# Patient Record
Sex: Female | Born: 1970 | Race: White | Hispanic: No | Marital: Married | Smoking: Never smoker
Health system: Southern US, Community
[De-identification: ages and names within clinical notes are randomized; demographics above are authoritative.]

## PROBLEM LIST (undated history)

## (undated) DIAGNOSIS — L299 Pruritus, unspecified: Secondary | ICD-10-CM

## (undated) DIAGNOSIS — F329 Major depressive disorder, single episode, unspecified: Secondary | ICD-10-CM

## (undated) DIAGNOSIS — A6000 Herpesviral infection of urogenital system, unspecified: Secondary | ICD-10-CM

## (undated) DIAGNOSIS — M542 Cervicalgia: Secondary | ICD-10-CM

## (undated) DIAGNOSIS — K219 Gastro-esophageal reflux disease without esophagitis: Secondary | ICD-10-CM

## (undated) DIAGNOSIS — F419 Anxiety disorder, unspecified: Secondary | ICD-10-CM

## (undated) DIAGNOSIS — E782 Mixed hyperlipidemia: Secondary | ICD-10-CM

## (undated) DIAGNOSIS — D071 Carcinoma in situ of vulva: Secondary | ICD-10-CM

## (undated) DIAGNOSIS — M545 Low back pain: Secondary | ICD-10-CM

## (undated) HISTORY — DX: Major depressive disorder, single episode, unspecified: F32.9

## (undated) HISTORY — DX: Low back pain: M54.5

## (undated) HISTORY — DX: Cervicalgia: M54.2

## (undated) HISTORY — DX: Anxiety disorder, unspecified: F41.9

## (undated) HISTORY — DX: Pruritus, unspecified: L29.9

## (undated) HISTORY — DX: Mixed hyperlipidemia: E78.2

## (undated) HISTORY — PX: DILATION AND EVACUATION: SHX1459

## (undated) HISTORY — DX: Gastro-esophageal reflux disease without esophagitis: K21.9

---

## 1995-08-23 HISTORY — PX: INNER EAR SURGERY: SHX679

## 1995-08-23 HISTORY — PX: APPENDECTOMY: SHX54

## 2007-08-14 ENCOUNTER — Inpatient Hospital Stay (HOSPITAL_COMMUNITY): Admission: AD | Admit: 2007-08-14 | Discharge: 2007-08-14 | Payer: Self-pay | Admitting: Obstetrics and Gynecology

## 2007-09-17 ENCOUNTER — Inpatient Hospital Stay (HOSPITAL_COMMUNITY): Admission: RE | Admit: 2007-09-17 | Discharge: 2007-09-20 | Payer: Self-pay | Admitting: Obstetrics and Gynecology

## 2007-09-21 ENCOUNTER — Encounter: Admission: RE | Admit: 2007-09-21 | Discharge: 2007-10-18 | Payer: Self-pay | Admitting: Obstetrics and Gynecology

## 2008-09-04 ENCOUNTER — Ambulatory Visit (HOSPITAL_COMMUNITY): Admission: RE | Admit: 2008-09-04 | Discharge: 2008-09-04 | Payer: Self-pay | Admitting: Obstetrics and Gynecology

## 2008-10-02 ENCOUNTER — Ambulatory Visit (HOSPITAL_COMMUNITY): Admission: RE | Admit: 2008-10-02 | Discharge: 2008-10-02 | Payer: Self-pay | Admitting: Obstetrics and Gynecology

## 2008-10-17 ENCOUNTER — Emergency Department (HOSPITAL_COMMUNITY): Admission: EM | Admit: 2008-10-17 | Discharge: 2008-10-17 | Payer: Self-pay | Admitting: Emergency Medicine

## 2008-11-03 ENCOUNTER — Ambulatory Visit (HOSPITAL_COMMUNITY): Admission: RE | Admit: 2008-11-03 | Discharge: 2008-11-03 | Payer: Self-pay | Admitting: Obstetrics and Gynecology

## 2008-11-06 ENCOUNTER — Ambulatory Visit (HOSPITAL_COMMUNITY): Admission: RE | Admit: 2008-11-06 | Discharge: 2008-11-06 | Payer: Self-pay | Admitting: Obstetrics and Gynecology

## 2008-11-14 ENCOUNTER — Ambulatory Visit (HOSPITAL_COMMUNITY): Admission: RE | Admit: 2008-11-14 | Discharge: 2008-11-14 | Payer: Self-pay | Admitting: Obstetrics and Gynecology

## 2008-12-31 ENCOUNTER — Inpatient Hospital Stay (HOSPITAL_COMMUNITY): Admission: AD | Admit: 2008-12-31 | Discharge: 2008-12-31 | Payer: Self-pay | Admitting: Obstetrics and Gynecology

## 2009-04-10 ENCOUNTER — Inpatient Hospital Stay (HOSPITAL_COMMUNITY): Admission: RE | Admit: 2009-04-10 | Discharge: 2009-04-12 | Payer: Self-pay | Admitting: Obstetrics and Gynecology

## 2009-04-10 ENCOUNTER — Encounter (INDEPENDENT_AMBULATORY_CARE_PROVIDER_SITE_OTHER): Payer: Self-pay | Admitting: Obstetrics and Gynecology

## 2010-05-18 ENCOUNTER — Ambulatory Visit (HOSPITAL_COMMUNITY): Admission: RE | Admit: 2010-05-18 | Discharge: 2010-05-18 | Payer: Self-pay | Admitting: Obstetrics and Gynecology

## 2010-06-15 ENCOUNTER — Ambulatory Visit (HOSPITAL_COMMUNITY): Admission: RE | Admit: 2010-06-15 | Discharge: 2010-06-15 | Payer: Self-pay | Admitting: Obstetrics and Gynecology

## 2010-07-22 ENCOUNTER — Inpatient Hospital Stay (HOSPITAL_COMMUNITY): Admission: AD | Admit: 2010-07-22 | Payer: Self-pay | Admitting: Obstetrics and Gynecology

## 2010-08-03 ENCOUNTER — Ambulatory Visit (HOSPITAL_COMMUNITY)
Admission: RE | Admit: 2010-08-03 | Discharge: 2010-08-03 | Payer: Self-pay | Source: Home / Self Care | Attending: Obstetrics and Gynecology | Admitting: Obstetrics and Gynecology

## 2010-08-13 ENCOUNTER — Inpatient Hospital Stay (HOSPITAL_COMMUNITY)
Admission: AD | Admit: 2010-08-13 | Discharge: 2010-08-13 | Payer: Self-pay | Source: Home / Self Care | Attending: Obstetrics and Gynecology | Admitting: Obstetrics and Gynecology

## 2010-08-31 ENCOUNTER — Ambulatory Visit (HOSPITAL_COMMUNITY): Admission: RE | Admit: 2010-08-31 | Payer: Self-pay | Source: Home / Self Care | Admitting: Obstetrics and Gynecology

## 2010-09-01 ENCOUNTER — Ambulatory Visit (HOSPITAL_COMMUNITY)
Admission: RE | Admit: 2010-09-01 | Discharge: 2010-09-01 | Payer: Self-pay | Source: Home / Self Care | Attending: Obstetrics and Gynecology | Admitting: Obstetrics and Gynecology

## 2010-09-11 ENCOUNTER — Other Ambulatory Visit (HOSPITAL_COMMUNITY): Payer: Self-pay | Admitting: Obstetrics and Gynecology

## 2010-09-11 DIAGNOSIS — O2690 Pregnancy related conditions, unspecified, unspecified trimester: Secondary | ICD-10-CM

## 2010-09-12 ENCOUNTER — Encounter: Payer: Self-pay | Admitting: Obstetrics and Gynecology

## 2010-09-29 ENCOUNTER — Ambulatory Visit (HOSPITAL_COMMUNITY)
Admission: RE | Admit: 2010-09-29 | Discharge: 2010-09-29 | Disposition: A | Discharge status: Home / Self Care | Payer: 59 | Source: Ambulatory Visit | Attending: Obstetrics and Gynecology | Admitting: Obstetrics and Gynecology

## 2010-09-29 DIAGNOSIS — O358XX0 Maternal care for other (suspected) fetal abnormality and damage, not applicable or unspecified: Secondary | ICD-10-CM | POA: Insufficient documentation

## 2010-09-29 DIAGNOSIS — O34219 Maternal care for unspecified type scar from previous cesarean delivery: Secondary | ICD-10-CM | POA: Insufficient documentation

## 2010-09-29 DIAGNOSIS — O2690 Pregnancy related conditions, unspecified, unspecified trimester: Secondary | ICD-10-CM

## 2010-09-29 DIAGNOSIS — O09529 Supervision of elderly multigravida, unspecified trimester: Secondary | ICD-10-CM | POA: Insufficient documentation

## 2010-10-07 ENCOUNTER — Encounter (HOSPITAL_COMMUNITY)
Admission: RE | Admit: 2010-10-07 | Discharge: 2010-10-07 | Disposition: A | Discharge status: Home / Self Care | Payer: 59 | Source: Ambulatory Visit | Attending: Obstetrics and Gynecology | Admitting: Obstetrics and Gynecology

## 2010-10-07 DIAGNOSIS — Z01812 Encounter for preprocedural laboratory examination: Secondary | ICD-10-CM | POA: Insufficient documentation

## 2010-10-07 LAB — CBC
MCH: 31.3 pg (ref 26.0–34.0)
MCHC: 32.7 g/dL (ref 30.0–36.0)
Platelets: 166 10*3/uL (ref 150–400)
RBC: 3.84 MIL/uL — ABNORMAL LOW (ref 3.87–5.11)

## 2010-10-07 LAB — SURGICAL PCR SCREEN: Staphylococcus aureus: POSITIVE — AB

## 2010-10-14 ENCOUNTER — Other Ambulatory Visit: Payer: Self-pay | Admitting: Obstetrics and Gynecology

## 2010-10-14 ENCOUNTER — Inpatient Hospital Stay (HOSPITAL_COMMUNITY)
Admission: RE | Admit: 2010-10-14 | Discharge: 2010-10-16 | DRG: 766 | Disposition: A | Discharge status: Home / Self Care | Payer: 59 | Source: Ambulatory Visit | Attending: Obstetrics and Gynecology | Admitting: Obstetrics and Gynecology

## 2010-10-14 ENCOUNTER — Inpatient Hospital Stay (HOSPITAL_COMMUNITY): Admission: AD | Admit: 2010-10-14 | Payer: Self-pay | Source: Ambulatory Visit | Admitting: Obstetrics and Gynecology

## 2010-10-14 DIAGNOSIS — Z302 Encounter for sterilization: Secondary | ICD-10-CM

## 2010-10-14 DIAGNOSIS — O34219 Maternal care for unspecified type scar from previous cesarean delivery: Principal | ICD-10-CM | POA: Diagnosis present

## 2010-10-15 LAB — CBC
HCT: 34.1 % — ABNORMAL LOW (ref 36.0–46.0)
Hemoglobin: 11 g/dL — ABNORMAL LOW (ref 12.0–15.0)
MCV: 96.9 fL (ref 78.0–100.0)
RBC: 3.52 MIL/uL — ABNORMAL LOW (ref 3.87–5.11)
WBC: 12.6 10*3/uL — ABNORMAL HIGH (ref 4.0–10.5)

## 2010-11-01 LAB — URINALYSIS, ROUTINE W REFLEX MICROSCOPIC
Glucose, UA: NEGATIVE mg/dL
Specific Gravity, Urine: 1.025 (ref 1.005–1.030)
Urobilinogen, UA: 0.2 mg/dL (ref 0.0–1.0)
pH: 6 (ref 5.0–8.0)

## 2010-11-01 LAB — URINE MICROSCOPIC-ADD ON

## 2010-11-08 NOTE — Op Note (Signed)
[name], [name]          ACCOUNT NO.:  [id number]  MEDICAL RECORD NO.:  [id number]         PATIENT TYPE:  WINP  LOCATION:  113                           FACILITY:  [facility]  PHYSICIAN:  Guy Sandifer. Henderson Cloud, M.D. DATE OF BIRTH:  February 07, 1971  DATE OF PROCEDURE: DATE OF DISCHARGE:                              OPERATIVE REPORT   PREOPERATIVE DIAGNOSES: 1. Previous cesarean section. 2. Desires permanent sterilization.  POSTOPERATIVE DIAGNOSIS: 1. Previous cesarean section. 2. Desires permanent sterilization.  PROCEDURE:  Repeat low transverse cesarean section and bilateral tubal ligation.  SURGEON:  Guy Sandifer. Henderson Cloud, MD  ANESTHESIA:  Spinal, Angelica Pou, MD  SPECIMENS:  Placenta and bilateral fallopian tube segments, both to pathology.  FINDINGS:  Viable female infant, Apgars of 9 and 9 at 1 and 5 minutes respectively.  Birth weight 8 pounds 8 ounces.  Arterial cord pH pending.  Estimated blood loss 500 mL.  INDICATIONS AND CONSENT:  This patient is a 40 year old married white female G8, P3, whose pregnancy has been complicated by three previous cesarean sections.  The history of aneuploidy with spontaneous miscarriage x2, advanced maternal age, and this pregnancy, a finding of echogenic bowel and choroid plexus cysts on ultrasound.  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  A previous cystic fibrosis screen on the patient had been negative.  Serial ultrasounds revealed resolution of the findings of both echogenic bowel and the choroid plexus cysts. Repeat cesarean section was recommended.  The patient desires permanent sterilization.  Potential risks and complications have been discussed preoperatively, including but limited to, infection, organ damage, bleeding requiring transfusion of blood products with HIV and hepatitis acquisition, DVT, PE, pneumonia.  Alternate methods of contraception, permanence of tubal  ligation, failure rate, increased ectopic risk have also been reviewed.  All questions have been answered, and consent is signed on the chart.  PROCEDURE:  The patient was identified, taken to the operating room. Spinal anesthetic is placed.  She is placed in a dorsal supine position with a 15-degree left lateral wedge.  Time-out undertaken.  She is prepped, Foley catheter was placed in the bladder to drain, and she is draped in a sterile fashion.  After testing for adequate spinal anesthesia, skin was entered through a Pfannenstiel incision taking out the old scar on the way in.  Dissection is carried out in layers to the peritoneum.  Peritoneum was incised superiorly and inferiorly. Vesicouterine peritoneum was taken down cephalad laterally, the bladder flap was developed and the bladder blade was placed.  Uterus was incised in a low transverse manner, the uterine cavity was entered bluntly. Uterine incision was extended cephalad laterally.  Thin meconium was noted and Pediatrics was notified at that point.  Vertex was delivered, baby easily delivers.  Cord is clamped and cut.  Baby is noted to be voiding.  Baby was handed to the awaiting pediatrics team.  Arterial cord pH, cord blood for routine studies on the baby and cord blood for blood banking per patient request were all obtained.  Placenta was then manually delivered and sent to pathology.  Uterine cavity is clean. Uterus is  closed in two running locking imbricating layers of 0 Monocryl suture which achieves good hemostasis.  Uterus is delivered from the incision for the second imbricating layer.  Tubes and ovaries were noted to be normal bilaterally.  Left fallopian tube was identified from cornu to fimbria.  It was grasped in its mid ampullary portion with a Babcock clamp and a knuckle of fallopian tube was doubly ligated with two free ties of 0 plain suture.  The intervening knuckle of tube was sharply resected.  Cautery was  used to assure complete hemostasis.  Similar procedure was carried out on the right side.  Uterus was returned to the abdomen.  Irrigation was carried out and all returns was clear. Anterior peritoneum was closed in running fashion with 0 Monocryl suture which was also used to reapproximate the pyramidalis muscle in midline. Anterior rectus fascia is closed in a running fashion with a 0 looped PDS suture.  Skin was closed with clips.  All sponge, instrument, needle counts were correct, and the patient is transferred to the recovery room in stable condition.     Guy Sandifer Henderson Cloud, M.D.     JET/MEDQ  D:  10/14/2010  T:  10/14/2010  Job:  [id number]  Electronically Signed by Harold Hedge M.D. on 11/08/2010 09:13:37 AM

## 2010-11-08 NOTE — Op Note (Signed)
  NAMEMARGEE, [name]          ACCOUNT NO.:  [id number]  MEDICAL RECORD NO.:  [id number]           PATIENT TYPE:  I  LOCATION:  9113                          [facility]  PHYSICIAN:  Guy Sandifer. Henderson Cloud, M.D. DATE OF BIRTH:  Jul 26, 1971  DATE OF PROCEDURE:  10/14/2010 DATE OF DISCHARGE:                              OPERATIVE REPORT   PREOPERATIVE DIAGNOSES: 1. Previous cesarean section. 2. Desires permanent sterilization.  POSTOPERATIVE DIAGNOSES: 1. Previous cesarean section. 2. Desires permanent sterilization.  PROCEDURES: 1. Repeat low transverse cesarean section. 2. Bilateral tubal ligation.  SURGEON:  Guy Sandifer. Henderson Cloud, MD  ANESTHESIA:  Spinal, Guy Sandifer. Henderson Cloud, MD  ESTIMATED BLOOD LOSS:  500 mL.  SPECIMENS: 1. Placenta. 2. Bilateral fallopian tube segments.  Both to Pathology.   Dictation ended at this point.     Guy Sandifer Henderson Cloud, M.D.     JET/MEDQ  D:  10/14/2010  T:  10/14/2010  Job:  [id number]  Electronically Signed by Harold Hedge M.D. on 11/08/2010 09:13:31 AM

## 2010-11-11 NOTE — Discharge Summary (Signed)
  NAMEDEVANNY, [name]          ACCOUNT NO.:  [id number]  MEDICAL RECORD NO.:  [id number]           PATIENT TYPE:  I  LOCATION:  9113                          [facility]  [facility]  PHYSICIAN:  Sarafina Puthoff L. Jatavia Keltner, M.D.DATE OF BIRTH:  February 09, 1971  DATE OF ADMISSION:  10/14/2010 DATE OF DISCHARGE:  10/16/2010                              DISCHARGE SUMMARY   ADMITTING DIAGNOSES: 1. Intrauterine pregnancy at term. 2. Previous cesarean section, desires repeat. 3. Multiparity, desires permanent sterilization.  DISCHARGE DIAGNOSES: 1. Status post low transverse cesarean section. 2. Viable female infant. 3. Permanent sterilization.  REASON FOR ADMISSION:  Please see dictated H and P.  HOSPITAL COURSE:  The patient is a 40 year old white married female, gravida 8, para 3, that presents to [facility] for scheduled cesarean section.  The patient had 3 previous cesarean sections and desired repeat.  Due to multiparity, the patient also requested permanent sterilization.  On the morning of admission, the patient was taken to the operating room where spinal anesthesia was administered without difficulty.  A low transverse incision was made, delivery of a viable female infant weighing 8 pounds and 8 ounces, Apgars of 9 at 1 minute and 9 at 5 minutes.  The patient tolerated the procedure well and taken to the recovery room in stable condition.  On postoperative day #1, the patient was doing well.  Vital signs were stable.  Abdomen soft.  Fundus firm and nontender.  On postoperative day #2, the patient was doing well and desired early discharge.  Vital signs were stable.  Abdomen soft.  Fundus firm and nontender.  Incision was clean, dry, and intact.  Laboratory findings showed hemoglobin 11.0, platelet count of 155,000, blood type is shown to be O+.  Discharge instructions were reviewed and the patient was later discharged home.  CONDITION ON DISCHARGE:  Stable.  DIET:  Regular as  tolerated.  ACTIVITY:  No heavy lifting, no driving x2 weeks, no vaginal entry.  FOLLOWUP:  The patient should follow up in the office in 1 week for an incision check.  She is to call for temperature greater than 100 degrees, persistent nausea, vomiting, heavy vaginal bleeding, and/or redness or drainage from the incisional site.  DISCHARGE MEDICATIONS: 1. Tylox, #30, 1 p.o. every 4-6 hours p.r.n. 2. Motrin 600 mg every 6 hours. 3. Prenatal vitamins 1 p.o. daily. 4. Colace 1 p.o. daily p.r.n.     Julio Sicks, N.P.   ______________________________ Stann Mainland. Vincente Poli, M.D.    CC/MEDQ  D:  11/05/2010  T:  11/06/2010  Job:  [id number]  Electronically Signed by Julio Sicks N.P. on 11/08/2010 08:21:37 AM Electronically Signed by Marcelle Overlie M.D. on 11/11/2010 01:43:36 PM

## 2010-11-27 LAB — CBC
Hemoglobin: 10.5 g/dL — ABNORMAL LOW (ref 12.0–15.0)
MCV: 100.4 fL — ABNORMAL HIGH (ref 78.0–100.0)
Platelets: 163 10*3/uL (ref 150–400)
RDW: 15.4 % (ref 11.5–15.5)
WBC: 9.2 10*3/uL (ref 4.0–10.5)

## 2010-11-27 LAB — RPR: RPR Ser Ql: NONREACTIVE

## 2010-12-02 LAB — URINALYSIS, MICROSCOPIC ONLY
Hgb urine dipstick: NEGATIVE
Leukocytes, UA: NEGATIVE
Nitrite: NEGATIVE
Specific Gravity, Urine: 1.02 (ref 1.005–1.030)
Urobilinogen, UA: 0.2 mg/dL (ref 0.0–1.0)

## 2010-12-02 LAB — URINE CULTURE

## 2010-12-16 ENCOUNTER — Ambulatory Visit: Payer: 59 | Admitting: Family Medicine

## 2011-01-03 ENCOUNTER — Ambulatory Visit (INDEPENDENT_AMBULATORY_CARE_PROVIDER_SITE_OTHER): Payer: 59 | Admitting: Family Medicine

## 2011-01-03 ENCOUNTER — Ambulatory Visit (INDEPENDENT_AMBULATORY_CARE_PROVIDER_SITE_OTHER)
Admission: RE | Admit: 2011-01-03 | Discharge: 2011-01-03 | Disposition: A | Discharge status: Home / Self Care | Payer: 59 | Source: Ambulatory Visit | Attending: Family Medicine | Admitting: Family Medicine

## 2011-01-03 ENCOUNTER — Encounter: Payer: Self-pay | Admitting: Family Medicine

## 2011-01-03 DIAGNOSIS — E663 Overweight: Secondary | ICD-10-CM

## 2011-01-03 DIAGNOSIS — M79609 Pain in unspecified limb: Secondary | ICD-10-CM

## 2011-01-03 DIAGNOSIS — Z0000 Encounter for general adult medical examination without abnormal findings: Secondary | ICD-10-CM

## 2011-01-03 DIAGNOSIS — M79671 Pain in right foot: Secondary | ICD-10-CM | POA: Insufficient documentation

## 2011-01-03 DIAGNOSIS — H9312 Tinnitus, left ear: Secondary | ICD-10-CM

## 2011-01-03 DIAGNOSIS — M79642 Pain in left hand: Secondary | ICD-10-CM

## 2011-01-03 DIAGNOSIS — B009 Herpesviral infection, unspecified: Secondary | ICD-10-CM

## 2011-01-03 DIAGNOSIS — H9319 Tinnitus, unspecified ear: Secondary | ICD-10-CM

## 2011-01-03 LAB — HEPATIC FUNCTION PANEL
ALT: 26 U/L (ref 0–35)
AST: 24 U/L (ref 0–37)
Alkaline Phosphatase: 78 U/L (ref 39–117)
Total Bilirubin: 0.5 mg/dL (ref 0.3–1.2)

## 2011-01-03 LAB — CBC WITH DIFFERENTIAL/PLATELET
Basophils Absolute: 0 10*3/uL (ref 0.0–0.1)
Eosinophils Absolute: 0.1 10*3/uL (ref 0.0–0.7)
HCT: 39.8 % (ref 36.0–46.0)
Hemoglobin: 13.7 g/dL (ref 12.0–15.0)
Lymphs Abs: 2.7 10*3/uL (ref 0.7–4.0)
MCHC: 34.6 g/dL (ref 30.0–36.0)
MCV: 94.4 fl (ref 78.0–100.0)
Monocytes Absolute: 0.5 10*3/uL (ref 0.1–1.0)
Neutro Abs: 4.1 10*3/uL (ref 1.4–7.7)
RDW: 13.8 % (ref 11.5–14.6)

## 2011-01-03 LAB — LIPID PANEL
HDL: 63 mg/dL (ref >39.00)
Total CHOL/HDL Ratio: 3
VLDL: 8.8 mg/dL (ref 0.0–40.0)

## 2011-01-03 LAB — SEDIMENTATION RATE: Sed Rate: 15 mm/hr (ref 0–22)

## 2011-01-03 LAB — RENAL FUNCTION PANEL
Albumin: 4 g/dL (ref 3.5–5.2)
BUN: 11 mg/dL (ref 6–23)
Creatinine, Ser: 0.8 mg/dL (ref 0.4–1.2)
GFR: 88.48 mL/min (ref >60.00)
Phosphorus: 3.7 mg/dL (ref 2.3–4.6)

## 2011-01-03 MED ORDER — VALACYCLOVIR HCL 500 MG PO TABS: 500.0000 mg | ORAL_TABLET | Freq: Two times a day (BID) | ORAL | Status: DC

## 2011-01-03 NOTE — Progress Notes (Signed)
[name] [id number] 11/20/1970 01/03/2011      Progress Note New Patient  Subjective  Chief Complaint  Chief Complaint  Patient presents with  . Establish Care    new patient    HPI  Patient is a 40 year old Caucasian female who is in today to establish new patient appointment. 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 She was placed on Valtrex and lesions resolved initially only to recur in the last few days. She reports she worse lesions the first time and at this point only has one small and posteriorly on the left hand side. She does not have history of cold sores. She is not worried about her marital relationship and feels that these lesions are from a long history in the past. She is otherwise doing relatively well she is beyond mom of 4 young children ages 6 years, 3 years, 21 months and now 3 months. She is not getting adequate sleep nor is she exercising more able to eat a regular basis. She had increased stress with her mother and her demented grandmother recently living nearby as well. She denies fevers, chills, chest pain, palpitations, shortness of breath, GI or GU complaints. She denies vaginal discharge or other lesions. She's never had a mammogram and she is nursing her child and has nursed her final 3.  Past Medical History  Diagnosis Date  . 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 Hand pain, left 01/03/2011  . Overweight 01/03/2011  . Right foot pain 01/03/2011  . Tinnitus, left 01/03/2011    Past Surgical History  Procedure Date  . Cesarean section     X 4  . Inner ear surgery 1997  . Appendectomy 1997    Family History  Problem Relation Age of Onset  . Hypertension Mother   . Heart disease Father     smoker  . Dementia Maternal Grandmother   . COPD Maternal Grandfather     smoker  . Cancer Maternal Grandfather     intestinal     History   Social History  . Marital Status: Married    Spouse Name: N/A    Number of Children: N/A  . Years of Education: N/A   Occupational History  . Not on file.   Social History Main Topics  . Smoking status: Never Smoker   . Smokeless tobacco: Never Used  . Alcohol Use: No  . Drug Use: No  . Sexually Active: Yes -- Female partner(s)   Other Topics Concern  . Not on file   Social History Narrative  . No narrative on file    No current outpatient prescriptions on file prior to visit.    Allergies  Allergen Reactions  . Augmentin Rash    Review of Systems  Review of Systems  Constitutional: Negative for fever, chills and malaise/fatigue.  HENT: Negative for hearing loss, nosebleeds and congestion.   Eyes: Negative for discharge.  Respiratory: Negative for cough, sputum production, shortness of breath and wheezing.   Cardiovascular: Negative for chest pain, palpitations and leg swelling.  Gastrointestinal: Negative for heartburn, nausea, vomiting, abdominal pain, diarrhea, constipation and blood in stool.  Genitourinary: Negative for dysuria, urgency, frequency and hematuria.  Musculoskeletal: Negative for myalgias, back pain and falls.       [Left hand pain s/p fall 3 weeks ago, right foot pain over posterior arch  with mild swelling Skin: Negative for rash.  Neurological: Negative for dizziness, tremors, sensory change, focal weakness, loss of consciousness, weakness and headaches.  Endo/Heme/Allergies: Negative for polydipsia. Does not bruise/bleed easily.  Psychiatric/Behavioral: Negative for depression and suicidal ideas. The patient is not nervous/anxious and does not have insomnia.     Objective  BP 112/72  Pulse 72  Temp(Src) 97.9 F (36.6 C) (Oral)  Ht 5' 1.5" (1.562 m)  Wt 161 lb 12.8 oz (73.392 kg)  BMI 30.08 kg/m2  SpO2 99%  Physical Exam  Physical Exam  Genitourinary: No vaginal discharge found.       Small, roughly 2 mm split in the  mucosa at the posterior forchette, tender with palp       Assessment & Plan  Right foot pain Patient reports a small lesion at the back portion of her left foot arch. She reports has been present for several months sometimes tender and sometimes not, does not appear to be enlarging and she denies any history of trauma or verrucous lesions. On palpation it appears to be a very small, roughly 4 mm, subcutaneous lesion consistent with a fibroma or a neuroma. She is encouraged to ice it regularly and massage with Aspercreme and to report if symptoms worsen or new symptoms develop.  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 The youngest child is 66 months of age. She is getting broken up putting out adequate sleep. She is very busy and has not been exercising. She is encouraged to try and are about 7 a sleep to eat small frequent meals lean proteins and complex carbs at the time even a basic walk daily we will check TSH, CBC, liver renal and an FLP for further evaluation  HSV-1 infection Recently had her first painful ulcerated lesions which were examined and tested by her OB/GYN. She was told that they were HSV 1 lesions. She was treated with Valtrex either milligrams twice daily for 3 days she improved but now believes the lesions are returning. On exam she does appear to have a small ulcerative lesion at the posterior forchette. We will refill Valtrex 500mg  po bid but this time for 7 days and we will request records from her OB/GYN office.  Hand pain, left Patient reports roughly 3 weeks ago she fell while holding her young child and landed on the back part of her left hand along the ulnar edge. At that time she had pain but no significant swelling or ecchymosis. Unfortunately as the weeks have gone by the pain is improved but not resolved and she is concerned about the small fracture we will check an x-ray today and she is encouraged to ice it and apply Aspercreme daily  report any worsening  Tinnitus, left Patient reports years ago she fell while cleaning her ear with a qtip resulting in a damaged ear drum and stapes. 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

## 2011-01-03 NOTE — Assessment & Plan Note (Signed)
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 The youngest child is 61 months of age. She is getting broken up putting out adequate sleep. She is very busy and has not been exercising. She is encouraged to try and are about 7 a sleep to eat small frequent meals lean proteins and complex carbs at the time even a basic walk daily we will check TSH, CBC, liver renal and an FLP for further evaluation

## 2011-01-03 NOTE — Assessment & Plan Note (Signed)
Patient reports a small lesion at the back portion of her left foot arch. She reports has been present for several months sometimes tender and sometimes not, does not appear to be enlarging and she denies any history of trauma or verrucous lesions. On palpation it appears to be a very small, roughly 4 mm, subcutaneous lesion consistent with a fibroma or a neuroma. She is encouraged to ice it regularly and massage with Aspercreme and to report if symptoms worsen or new symptoms develop.

## 2011-01-03 NOTE — Assessment & Plan Note (Signed)
Recently had her first painful ulcerated lesions which were examined and tested by her OB/GYN. She was told that they were HSV 1 lesions. She was treated with Valtrex either milligrams twice daily for 3 days she improved but now believes the lesions are returning. On exam she does appear to have a small ulcerative lesion at the posterior forchette. We will refill Valtrex 500mg  po bid but this time for 7 days and we will request records from her OB/GYN office.

## 2011-01-03 NOTE — Assessment & Plan Note (Signed)
Patient reports years ago she fell while cleaning her ear with a qtip resulting in a damaged ear drum and stapes. 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

## 2011-01-03 NOTE — Assessment & Plan Note (Signed)
Patient reports roughly 3 weeks ago she fell while holding her young child and landed on the back part of her left hand along the ulnar edge. At that time she had pain but no significant swelling or ecchymosis. Unfortunately as the weeks have gone by the pain is improved but not resolved and she is concerned about the small fracture we will check an x-ray today and she is encouraged to ice it and apply Aspercreme daily report any worsening

## 2011-01-04 NOTE — Discharge Summary (Signed)
[name], [name]          ACCOUNT NO.:  [id number]   MEDICAL RECORD NO.:  [id number]          PATIENT TYPE:  INP   LOCATION:  9133                          [facility]ACILITY:  [facility]   PHYSICIAN:  Michelle L. Grewal, M.D.DATE OF BIRTH:  [birth year]   DATE OF ADMISSION:  09/17/2007  DATE OF DISCHARGE:  09/20/2007                               DISCHARGE SUMMARY   ADMITTING DIAGNOSIS:  1. Intrauterine pregnancy at term.  2. Previous cesarean delivery, desires repeat.   DISCHARGE DIAGNOSIS:  1. Status post low transverse cesarean section.  2. Viable female infant.   PROCEDURE:  Repeat low transverse cesarean section.   REASON FOR ADMISSION:  Please see dictated H&P.   HOSPITAL COURSE:  The patient is a 40 year old, gravida 5, para 1 that  was admitted to [facility] for a scheduled cesarean  section.  The patient had had a previous cesarean delivery and desired  repeat.  On the morning of admission, the patient was taken to the  operating room where spinal anesthesia was administered without  difficulty.  A low transverse incision was made with delivery of a  viable female infant weighing 7 pounds 14 ounces with Apgars of 9 at 1  minute and 9 at 5 minutes.  The patient tolerated the procedure well and  was taken to the recovery room in stable condition.  On postoperative  day #1, the patient was without complaint.  Vital signs were stable.  She is afebrile.  Abdomen soft with good return of bowel function.  Fundus was firm and nontender.  Abdominal dressing was partially removed  revealing an incision that was clean, dry and intact.  The Foley had  been discontinued and the patient was voiding well. Laboratory findings  revealed a hemoglobin of 10.6, platelet count 134,000, WBC count of 8.8.  Blood type was noted to be O+. The saline lock was discontinued and the  patient was allowed to shower. On postoperative day #2, the patient  stated that she had had some difficulty  receiving pain medication from  the previous day.  She had decided not to be discharged today as  previously planned. Vital signs were stable.  She is afebrile.  Abdomen  soft.  Fundus firm and nontender.  Incision was clean, dry and intact.  She is ambulating well. On postoperative day #3, the patient stated that  she was feeling better.  She was ready for discharge.  Vital signs were  stable. She was afebrile.  Abdomen soft.  Fundus firm and nontender. The  incision was clean, dry and intact.  Staples removed and the patient was  later discharged home.   CONDITION ON DISCHARGE:  Good.   DIET:  Regular as tolerated.   ACTIVITY:  No heavy lifting. No driving x2 weeks, no vaginal entry.   FOLLOW UP:  The patient to follow up in the office in 1-2 weeks for an  incision check.  She is to call for a temperature greater than 100  degrees, persistent nausea, vomiting, heavy vaginal bleeding and/or  redness or drainage from the incisional site.   DISCHARGE MEDICATIONS:  1. Tylox #30  one p.o. every 4-6 hours.  2. Motrin 600 mg every 6 hours.  3. Prenatal vitamins 1 p.o. daily.  4. Colace 1 p.o. daily.      Julio Sicks, N.P.      Stann Mainland. Vincente Poli, M.D.  Electronically Signed    CC/MEDQ  D:  09/30/2007  T:  10/01/2007  Job:  [id number]

## 2011-01-04 NOTE — Op Note (Signed)
[name], [name]          ACCOUNT NO.:  [id number]   MEDICAL RECORD NO.:  [id number]          PATIENT TYPE:  INP   LOCATION:  9105                          FACILITY:  [facility]   PHYSICIAN:  Guy Sandifer. Henderson Cloud, M.D. DATE OF BIRTH:  1971-01-20   DATE OF PROCEDURE:  04/10/2009  DATE OF DISCHARGE:                               OPERATIVE REPORT   PREOPERATIVE DIAGNOSES:  1. Intrauterine pregnancy at 61 weeks' estimated gestational age.  2. Previous cesarean section x2.   POSTOPERATIVE DIAGNOSES:  1. Intrauterine pregnancy at 45 weeks' estimated gestational age.  2. Previous cesarean section x2.   PROCEDURE:  Repeat low transverse cesarean section.   SURGEON:  Guy Sandifer. Henderson Cloud, MD   ANESTHESIA:  Spinal.   SPECIMENS:  1. Placenta to pathology.  2. Cord blood for private blood banking per patient request.   FINDINGS:  Viable female infant, Apgars of 9 and 9 at one and five minutes  respectively.  Birth weight 8 pounds 15 ounces.  Arterial cord pH 7.12.   ESTIMATED BLOOD LOSS:  600 mL.   INDICATIONS AND CONSENT:  This patient is a 40 year old married white  female with two previous cesarean sections.  She is admitted for repeat.  Potential risks and complications had been discussed preoperatively  including, but not limited to infection, organ damage, bleeding  requiring transfusion of blood products with HIV, and hepatitis  acquisition, DVT, PE, pneumonia.  All questions had been answered and  consent was signed on the chart.   PROCEDURE IN DETAIL:  The patient was taken to the operating room where  she was identified, spinal anesthetic was placed and she was placed in  dorsal supine position with a 15-degree left lateral wedge.  She was  then prepped, Foley catheter was placed, and the bladder was drained.  She was draped in a sterile fashion.  Time-out undertaken.  After  testing for adequate spinal anesthesia, skin was entered through a  Pfannenstiel incision and the old  scar was taken out on the way in.  Dissection was carried out in layers to the peritoneum.  There was some  scarring in the subfascial tissues.  Peritoneum was incised, extended  superiorly and inferiorly.  Vesicouterine peritoneum was taken down  cephalad laterally.  Uterus was incised in a low transverse manner and  the uterine cavity was entered bluntly with a hemostat.  The uterine  incision was extended cephalad laterally with fingers.  Artificial  rupture of membranes for clear fluid is carried out.  Vertex was  delivered without difficulty.  Oral and nasopharynx were thoroughly  suctioned.  Nuchal cord x1 was reduced and the remainder of the baby was  delivered.  Good cry and tone was noted.  Cord was clamped and cut.  The  baby was handed to the awaiting pediatrics team.  Placenta was then  manually delivered.  The uterine cavity is clean.  The uterus was closed  in two running locking imbricating layers of 0 Monocryl suture.  Good  hemostasis was noted.  Tubes and ovaries are normal bilaterally.  Anterior peritoneum was closed in running fashion with 0 Monocryl  suture, which was also used to reapproximate the pyramidalis muscle in  the midline.  Anterior rectus fascia was closed in running fashion with  a double-stranded zero PDS suture.  Skin was closed with clips.  All  sponge, instrument, and needle counts are correct and the patient was  transferred to the recovery room in stable condition.      Guy Sandifer Henderson Cloud, M.D.  Electronically Signed     JET/MEDQ  D:  04/10/2009  T:  04/11/2009  Job:  [id number]

## 2011-01-04 NOTE — Op Note (Signed)
[name], [name] NO.:  [id number]   MEDICAL RECORD NO.:  [id number]          PATIENT TYPE:  INP   LOCATION:  9133                          FACILITY:  [facility]   PHYSICIAN:  Duke Salvia. Marcelle Overlie, M.D.DATE OF BIRTH:  Oct 18, 1970   DATE OF PROCEDURE:  09/17/2007  DATE OF DISCHARGE:                               OPERATIVE REPORT   PREOPERATIVE DIAGNOSIS:  Term intrauterine pregnancy, previous cesarean  section, for repeat.   POSTOPERATIVE DIAGNOSIS:  Term intrauterine pregnancy, previous cesarean  section, for repeat.   PROCEDURE:  Repeat low transverse cesarean section.   SURGEON:  Duke Salvia. Marcelle Overlie, M.D.   ANESTHESIA:  Spinal.   COMPLICATIONS:  None.   DRAINS:  Foley catheter.   BLOOD LOSS:  700 mL.   PROCEDURE AND FINDINGS:  The patient taken to the operating room.  After  adequate level of spinal anesthetic was obtained with the patient's legs  supine and in left tilt position.  The abdomen prepped and draped usual  manner for sterile abdominal procedures.  Foley catheter positioned  draining clear urine.  Transverse incision was made excising the old  scar, carried down to the fascia which was incised and extended  transversely.  Rectus muscles divided in the midline.  Peritoneum  entered superiorly without incident and extended vertically.  The  vesicouterine serosa was incised and the bladder was bluntly and sharply  dissected below.  Bladder blade repositioned.  Transverse incision made  in the lower segment, extended with blunt dissection.  Clear fluid  noted.  The patient delivered of a female 7 pounds 14 ounces, Apgars  09/09 in the vertex presentation.  The infant was suctioned, cord  clamped and passed to the pediatrician for further care.  Cord blood was  obtained for private banking.  Placenta delivered spontaneously intact.  Uterus exteriorized, cavity wiped clean with laparotomy pack, closure  was obtained first layer of 0-0 chromic in a  locked fashion followed by  an imbricating layer of 0-0 chromic.  This was hemostatic.  Tubes and  ovaries inspection noted to be normal.  The bladder flap area was intact  and hemostatic.  Prior to closure, sponge, needle, instrument counts  reported as correct x2.  Peritoneum closed with a 3-0 Vicryl running  suture.  Rectus muscles reapproximated in the midline with 3-0 Vicryl  interrupted sutures.  Fascia closed from laterally to  midline on either side with a 0-0 PDS suture.  Subcutaneous tissue was  hemostatic.  Clips and Steri-Strips used on the skin.  Sterile pressure  dressing was applied.  Clear urine noted at end of the case.  She did  receive Pitocin IV and also Ancef 1 gram IV preoperatively.  Mother and  baby in good condition at that point.      Richard M. Marcelle Overlie, M.D.  Electronically Signed     [facility]/MEDQ  D:  09/17/2007  T:  09/17/2007  Job:  [id number]

## 2011-01-04 NOTE — H&P (Signed)
[name], [name] NO.:  [id number]   MEDICAL RECORD NO.:  [id number]          PATIENT TYPE:  INP   LOCATION:  NA                            FACILITY:  [facility]   PHYSICIAN:  Duke Salvia. Marcelle Overlie, M.D.DATE OF BIRTH:  [birth year]   DATE OF ADMISSION:  DATE OF DISCHARGE:                              HISTORY & PHYSICAL   DATE OF SCHEDULED SURGERY:  September 17, 2007.   CHIEF COMPLAINT:  For repeat cesarean section.   HISTORY OF PRESENT ILLNESS:  A 40 year old G5 P1 0-3-1.  This is an IVF  pregnancy after 3 early SABs.  She had a prior cesarean in 2006 at term  for failure to progress.  This has been an uncomplicated pregnancy.  Blood type is O positive.  She transferred from [place] at [redacted] weeks  gestation.  Her GBS screen was negative.  She is scheduled for repeat  cesarean section.  This procedure including risks of bleeding,  infection, adjacent organ injury, other complications related to  phlebitis, wound infection, transfusion reviewed with her, which she  understands and accepts.   PAST MEDICAL HISTORY:  Allergies:  None.  Obstetrical history:  Please see her [employer] form for complete  details.   PHYSICAL EXAMINATION:  Temperature 98.2, blood pressure 110/60.  HEENT:  Unremarkable.  NECK:  Supple, without masses.  LUNGS:  Clear.  CARDIOVASCULAR:  Regular rate and rhythm without murmurs, rubs or  gallops noted.  BREASTS:  Not examined.  ABDOMEN:  Term fundal height.  Fetal heart rate was 140.  Cervix not  examined.  EXTREMITIES:  Unremarkable.  NEUROLOGIC:  Unremarkable.   IMPRESSION:  Term intrauterine pregnancy for repeat cesarean section.   PLAN:  Procedure and risks reviewed as above.      Richard M. Marcelle Overlie, M.D.  Electronically Signed     [facility]/MEDQ  D:  09/14/2007  T:  09/14/2007  Job:  [id number]

## 2011-01-13 ENCOUNTER — Telehealth: Payer: Self-pay

## 2011-01-13 NOTE — Telephone Encounter (Signed)
If the bumps are painful and look like little blisters, then it is possibly HSV.  If they are NOT fluid-filled blisters or painful ulcerated areas, then it is NOT likely HSV.  Sometimes it is best with rashes to simply come in an let us look b/c they are so difficult to diagnose/describe, etc over the phone.  Hope this helps.

## 2011-01-13 NOTE — Telephone Encounter (Signed)
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 Pt would like to know if she should make an appt to get them looked at? Pt states they don't itch

## 2011-01-13 NOTE — Telephone Encounter (Signed)
Patient states she is still not completely healed and took her last Valtrex this morning. Patient would like to know if she shoulc refill the Valtrex and is this okay to keep taking while breast feeding? Please advise?

## 2011-01-13 NOTE — Telephone Encounter (Signed)
OK ti guve a refill with same sig and same number, is considered safe in breast feeding, I double checked it again this morning if she is concerned

## 2011-01-13 NOTE — Telephone Encounter (Signed)
Pt inforned

## 2011-01-14 NOTE — Telephone Encounter (Signed)
I have attempted to contact this patient by phone with the following results: left message to return my call on answering machine.

## 2011-01-19 ENCOUNTER — Telehealth: Payer: Self-pay

## 2011-01-19 MED ORDER — ACYCLOVIR 400 MG PO TABS: 400.0000 mg | ORAL_TABLET | Freq: Every day | ORAL | Status: DC

## 2011-01-19 NOTE — Telephone Encounter (Signed)
Pt states the Valtrex is not working? Is there anything else she can take? Pt states she is getting sores inside and outside her mouth? Pt states she noticed some spots on her son and is being treated?

## 2011-01-19 NOTE — Telephone Encounter (Signed)
If she feels the Valtrex is not working I can switch her to another anti viral but it is 5 x a day. If it helped while she was on it and then they recurred after that we could keep her on a prophylactic daily dose of Valtrex for the next 6 months. If the lesions are worse or painful I should look at them

## 2011-01-19 NOTE — Telephone Encounter (Signed)
I have attempted to contact this patient by phone with the following results: left message to return my call on answering machine.

## 2011-01-19 NOTE — Telephone Encounter (Signed)
Pt states she is healed.

## 2011-01-20 NOTE — Telephone Encounter (Signed)
Patient agreeable to trying a course of Acyclovir, 400mg  po 5 x daily x 7 days if no improvement needs to come in

## 2011-01-27 ENCOUNTER — Ambulatory Visit (INDEPENDENT_AMBULATORY_CARE_PROVIDER_SITE_OTHER): Payer: 59 | Admitting: Family Medicine

## 2011-01-27 ENCOUNTER — Encounter: Payer: Self-pay | Admitting: Family Medicine

## 2011-01-27 VITALS — BP 106/76 | HR 65 | Temp 98.2°F | Ht 61.5 in | Wt 163.0 lb

## 2011-01-27 DIAGNOSIS — B009 Herpesviral infection, unspecified: Secondary | ICD-10-CM

## 2011-01-27 DIAGNOSIS — N760 Acute vaginitis: Secondary | ICD-10-CM

## 2011-01-27 MED ORDER — NYSTATIN 100000 UNIT/GM EX CREA: TOPICAL_CREAM | Freq: Two times a day (BID) | CUTANEOUS | Status: DC

## 2011-01-27 MED ORDER — FLUCONAZOLE 150 MG PO TABS: ORAL_TABLET | ORAL | Status: DC

## 2011-01-27 MED ORDER — FAMCICLOVIR 500 MG PO TABS: 500.0000 mg | ORAL_TABLET | Freq: Three times a day (TID) | ORAL | Status: DC

## 2011-01-27 NOTE — Patient Instructions (Signed)
Monilial Vaginitis (Yeast Infections) Vaginitis in a soreness, swelling and redness (inflammation) of the vagina and vulva. Monilial vaginitis is not a sexually transmitted infection.  CAUSES Yeast vaginitis is caused by yeast (candida) that is normally found in your vagina. With a yeast infection, the candida has overgrown in number to a point that upsets the chemical balance. SYMPTOMS   White thick vaginal discharge.   Swelling, itching, redness and irritation of the vagina and possibly the lips of the vagina (vulva).   Burning or painful urination.   Painful intercourse.  DIAGNOSIS Things that may contribute to monilial vaginitis are:  Postmenopausal and virginal states.  Pregnancy.   Infections.   Being tired, sick or stressed, especially if you had monilial vaginitis in the past.   Diabetes. Good control will help lower the chance.   Birth control pills.  Tight fitting garments.   Using bubble bath, feminine sprays, douches or deodorant tampons.   Taking certain medications that kill germs (antibiotics).   Sporadic recurrence can occur if you become ill.   TREATMENT  Your caregiver will give you medication.  There are several kinds of anti monilial vaginal creams and suppositories specific for monilial vaginitis. For recurrent yeast infections, use a suppository or cream in the vagina 2 times a week, or as directed.   Anti-monilial or steroid cream for the itching or irritation of the vulva may also be used. Get your caregiver's permission.   Painting the vagina with methylene blue solution may help if the monilial cream does not work.   Eating yogurt may help prevent monilial vaginitis.  HOME CARE INSTRUCTIONS  Finish all medication as prescribed.   Do not have sex until treatment is completed or after your caregiver tells you it is okay.   Take warm sitz baths.   Do not douche.   Do not use tampons, especially scented ones.   Wear cotton underwear.    Avoid tight pants and panty hose.   Tell your sexual partner that you have a yeast infection. They should go to their caregiver if they have symptoms such as mild rash or itching.   Your sexual partner should be treated as well if your infection is difficult to eliminate.   Practice safer sex. Use condoms.   Some vaginal medications cause latex condoms to fail. Vaginal medications that harm condoms are:   Cleocin cream.   Butoconazole (Femstat).   Terconazole (Terazol) vaginal suppository.   Miconazole (Monistat) (may be purchased over the counter).  SEEK MEDICAL CARE IF:  You have a temperature by mouth above 104.   The infection is getting worse after 2 days of treatment.   The infection is not getting better after 3 days of treatment.   You develop blisters in or around your vagina.   You develop vaginal bleeding, and it is not your menstrual period.   You have pain when you urinate.   You develop intestinal problems.   You have pain with sexual intercourse.  Document Released: 05/18/2005 Document Re-Released: 11/02/2009 [facility] Patient Information 2011 [place], [place].

## 2011-01-31 ENCOUNTER — Telehealth: Payer: Self-pay

## 2011-01-31 DIAGNOSIS — B009 Herpesviral infection, unspecified: Secondary | ICD-10-CM

## 2011-01-31 MED ORDER — VALACYCLOVIR HCL 1 G PO TABS: ORAL_TABLET | ORAL | Status: DC

## 2011-01-31 NOTE — Telephone Encounter (Signed)
Pt called stating that she had a drug reaction to Famvir so she quit taking. Pt also stated she has a yeast infection in her breast? Pt states she is burning in her private area. Pt states spouse looked and doesn't see any sores but pt states she can feel them? Pt would like advise from MD. Please call pt back at [phone] she is on vacation at [facility].

## 2011-01-31 NOTE — Telephone Encounter (Signed)
I have attempted to contact this patient by phone with the following results: left message to return my call on answering machine [phone]).

## 2011-01-31 NOTE — Telephone Encounter (Signed)
Pt stated the reaction was really bad itching.

## 2011-01-31 NOTE — Telephone Encounter (Signed)
RC from pt.  Advised of advice below.  Pt is agreeable to [facility] and switching back to Valtrex.  I will call Valtrex to local [facility] and pt will have transferred to pharmacy in [facility].  Pt will continue nystatin to breast.  Pt inquires about culture results from Thursday, advised pt that results are not in yet and we will call her when we have them back.  Pt is agreeable to these plans.

## 2011-01-31 NOTE — Telephone Encounter (Signed)
We gave her two meds for yeast at the visit on the visit 6/7 would continue with the Diflucan and Nystatin for that. Make sure she is taking a probiotic and a yogurt daily. Can cleanse all irritated skin bid with [facility] Astringent then blow dry. What was the reaction to Famvir? If it were me I would switch back to Valtrex 1000mg  po tid x 10 days the drop to prophylactic dose of 1000mg  po daily, disp # 50 with 2 rf

## 2011-02-02 ENCOUNTER — Encounter: Payer: Self-pay | Admitting: Family Medicine

## 2011-02-02 DIAGNOSIS — N760 Acute vaginitis: Secondary | ICD-10-CM | POA: Insufficient documentation

## 2011-02-02 NOTE — Assessment & Plan Note (Signed)
Patient is continuing to have vaginal burning and discomfort. Her symptoms are worst at the posterior forcette but she has discomfort throughout and some discharge. Will treat patient for vaginitis, appearance is most consistent with yeast so will treat with Diflucan and Nystatin and will also cover bv due to patient's discomfort. She will take a probiotic and follow other instructions for hygiene that were given

## 2011-02-02 NOTE — Progress Notes (Signed)
[name] [id number] 02-23-1971 02/02/2011      Progress Note-Follow Up  Subjective  Chief Complaint  Chief Complaint  Patient presents with  . Herpes Zoster    really hurts and thinks they are getting worse    HPI  Patient is in today very emotional and crying easily concerned about her fatigue and her vaginal lesions which are still uncomfortable. Her husband is away on a business trip and the baby was up much last night. So the patient did not get much sleep and she feels very tired and emotional today. She still has the painful vaginal lesion posteriorly and some vaginal discharge. She does not feel as if the Acyclovir is helping. No fevers/chills/CP/palp/SOB/GI or urinary c/o except for some intermittent dysuria when the urine hits the fragile vaginal mucosa.  Past Medical History  Diagnosis Date  . 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 Hand pain, left 01/03/2011  . Overweight 01/03/2011  . Right foot pain 01/03/2011  . Tinnitus, left 01/03/2011  . Vaginitis 02/02/2011    Past Surgical History  Procedure Date  . Cesarean section     X 4  . Inner ear surgery 1997  . Appendectomy 1997  . Tubal ligation     Family History  Problem Relation Age of Onset  . Hypertension Mother   . Heart disease Father     smoker  . Dementia Maternal Grandmother   . Heart disease Maternal Grandmother     s/p MI  . COPD Maternal Grandfather     smoker  . Cancer Maternal Grandfather     intestinal    History   Social History  . Marital Status: Married    Spouse Name: N/A    Number of Children: N/A  . Years of Education: N/A   Occupational History  . Not on file.   Social History Main Topics  . Smoking status: Never Smoker   . Smokeless tobacco: Never Used  . Alcohol Use: No  . Drug Use: No  . Sexually Active: Yes -- Female partner(s)   Other Topics Concern  . Not on file   Social History Narrative  . No narrative on file    Current Outpatient  Prescriptions on File Prior to Visit  Medication Sig Dispense Refill  . valACYclovir (VALTREX) 1000 MG tablet Take 1 tablet by mouth three times daily for 10 days, then take 1 tablet by mouth once daily  50 tablet  2    Allergies  Allergen Reactions  . Augmentin Rash    Review of Systems  Review of Systems  Constitutional: Positive for malaise/fatigue. Negative for fever and chills.  HENT: Negative for congestion.   Eyes: Negative for discharge.  Respiratory: Negative for shortness of breath.   Cardiovascular: Negative for chest pain, palpitations and leg swelling.  Gastrointestinal: Negative for nausea, abdominal pain and diarrhea.  Genitourinary: Positive for dysuria and frequency. Negative for urgency, hematuria and flank pain.       Urine can sting when it hits the mucosa that is broken down. She is struggling with a persistent painful lesion at the back of the vaginal mucosa. And she also ntes some mild discharge without odor or color  Musculoskeletal: Negative for falls.  Skin: Negative for rash.  Neurological: Negative for loss of consciousness and headaches.  Endo/Heme/Allergies: Negative for polydipsia.  Psychiatric/Behavioral: Negative for depression and suicidal ideas. The patient is not nervous/anxious and does not have insomnia.     Objective  BP 106/76  Pulse 65  Temp(Src) 98.2 F (36.8 C) (Oral)  Ht 5' 1.5" (1.562 m)  Wt 163 lb (73.936 kg)  BMI 30.30 kg/m2  SpO2 99%  Physical Exam  Physical Exam  Constitutional: She is oriented to person, place, and time and well-developed, well-nourished, and in no distress. No distress.  HENT:  Head: Normocephalic and atraumatic.  Eyes: Conjunctivae are normal.  Neck: Neck supple. No thyromegaly present.  Cardiovascular: Normal rate, regular rhythm and normal heart sounds.   No murmur heard. Pulmonary/Chest: Effort normal and breath sounds normal. She has no wheezes.  Abdominal: She exhibits no distension and no  mass.  Genitourinary: Uterus normal, cervix normal, right adnexa normal and left adnexa normal.       Lesion is split at posterior forcette and inflamed, copious thin whitish vaginal discharge is also noted  Musculoskeletal: She exhibits no edema.  Lymphadenopathy:    She has no cervical adenopathy.  Neurological: She is alert and oriented to person, place, and time.  Skin: Skin is warm and dry. No rash noted. She is not diaphoretic.  Psychiatric: Memory, affect and judgment normal.    Lab Results  Component Value Date   TSH 1.48 01/03/2011   Lab Results  Component Value Date   WBC 7.4 01/03/2011   HGB 13.7 01/03/2011   HCT 39.8 01/03/2011   MCV 94.4 01/03/2011   PLT 201.0 01/03/2011   Lab Results  Component Value Date   CREATININE 0.8 01/03/2011   BUN 11 01/03/2011   NA 135 01/03/2011   K 4.6 01/03/2011   CL 102 01/03/2011   CO2 25 01/03/2011   Lab Results  Component Value Date   ALT 26 01/03/2011   AST 24 01/03/2011   ALKPHOS 78 01/03/2011   BILITOT 0.5 01/03/2011   Lab Results  Component Value Date   CHOL 197 01/03/2011   Lab Results  Component Value Date   HDL 63.00 01/03/2011   Lab Results  Component Value Date   LDLCALC 125* 01/03/2011   Lab Results  Component Value Date   TRIG 44.0 01/03/2011   Lab Results  Component Value Date   CHOLHDL 3 01/03/2011     Assessment & Plan  Vaginitis Patient is continuing to have vaginal burning and discomfort. Her symptoms are worst at the posterior forcette but she has discomfort throughout and some discharge. Will treat patient for vaginitis, appearance is most consistent with yeast so will treat with Diflucan and Nystatin and will also cover bv due to patient's discomfort. 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

## 2011-02-02 NOTE — Assessment & Plan Note (Signed)
Acyclovir has been ineffective so will change antiviral therapy and reevaluate

## 2011-02-04 ENCOUNTER — Telehealth: Payer: Self-pay | Admitting: *Deleted

## 2011-02-04 NOTE — Telephone Encounter (Signed)
Please notify patient, she has already been treated for everything that would have been tested, if she continues to be symptomatic we would need to retest anyway, continue with current therapy

## 2011-02-04 NOTE — Telephone Encounter (Signed)
Pt notified of below.  She is understanding.  Pt has appt on Monday.  She states that she is "in a bad way" and may not be able to wait until Monday.  I advised pt if she can not wait until then to seek medical attention at an Urgent Care.  She is agreeable.

## 2011-02-04 NOTE — Telephone Encounter (Signed)
Pt called requesting results of recent labs.  No results in system.  Per lab, specimen was not submitted in time due to courier error.  Please advise plan.   Pt also states she is still having problems in vaginal area, appt is made for 02/07/11.

## 2011-02-07 ENCOUNTER — Encounter: Payer: Self-pay | Admitting: Family Medicine

## 2011-02-07 ENCOUNTER — Ambulatory Visit (INDEPENDENT_AMBULATORY_CARE_PROVIDER_SITE_OTHER): Payer: 59 | Admitting: Family Medicine

## 2011-02-07 VITALS — BP 117/79 | HR 66 | Temp 98.1°F | Ht 61.5 in | Wt 164.1 lb

## 2011-02-07 DIAGNOSIS — N760 Acute vaginitis: Secondary | ICD-10-CM

## 2011-02-07 DIAGNOSIS — B009 Herpesviral infection, unspecified: Secondary | ICD-10-CM

## 2011-02-07 MED ORDER — FLUCONAZOLE 150 MG PO TABS: ORAL_TABLET | ORAL | Status: DC

## 2011-02-07 NOTE — Assessment & Plan Note (Signed)
Patient reports she feels this is finally improving. Her posterior lesion is still present but less uncomfortable, finish course of tid Valtrex and then drop to once daily, report any worsening of symptoms or concerns

## 2011-02-07 NOTE — Progress Notes (Signed)
[name] [id number] [birth year] 02/07/2011      Progress Note-Follow Up  Subjective  Chief Complaint  Chief Complaint  Patient presents with  . Follow-up    follow up on lesions    HPI  Patient is in today for follow up on her persistent vaginitis. She is feeling much better today for the first time in quite some time. The discharge is decreasing, the itching is still present but has lessened somewhat. No odor/fever/chills/ abd or back pain. No new c/o. The posterior viral lesion is finally healing and while it is still present it is much less uncomfortable. She had a reaction to the Famvir and after being off of it for 24 hours the reaction disappeared. She describes a sensation of her skin feeling like it was burning but she never saw any rash. No CP/palp/SOB/GI c/o today  Past Medical History  Diagnosis Date  . 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 Hand pain, left 01/03/2011  . Overweight 01/03/2011  . Right foot pain 01/03/2011  . Tinnitus, left 01/03/2011  . Vaginitis 02/02/2011    Past Surgical History  Procedure Date  . Cesarean section     X 4  . Inner ear surgery 1997  . Appendectomy 1997  . Tubal ligation     Family History  Problem Relation Age of Onset  . Hypertension Mother   . Heart disease Father     smoker  . Dementia Maternal Grandmother   . Heart disease Maternal Grandmother     s/p MI  . COPD Maternal Grandfather     smoker  . Cancer Maternal Grandfather     intestinal    History   Social History  . Marital Status: Married    Spouse Name: N/A    Number of Children: N/A  . Years of Education: N/A   Occupational History  . Not on file.   Social History Main Topics  . Smoking status: Never Smoker   . Smokeless tobacco: Never Used  . Alcohol Use: No  . Drug Use: No  . Sexually Active: Yes -- Female partner(s)   Other Topics Concern  . Not on file   Social History Narrative  . No narrative on file    Current  Outpatient Prescriptions on File Prior to Visit  Medication Sig Dispense Refill  . Prenat w/o A-FeCbn-DSS-FA-DHA (CITRANATAL HARMONY) 28-1-250 MG CAPS       . valACYclovir (VALTREX) 1000 MG tablet Take 1 tablet by mouth three times daily for 10 days, then take 1 tablet by mouth once daily  50 tablet  2  . DISCONTD: fluconazole (DIFLUCAN) 150 MG tablet 1 tab po daily x 3 days  3 tablet  1  . DISCONTD: nystatin (MYCOSTATIN) cream Apply topically 2 (two) times daily.  30 g  0    Allergies  Allergen Reactions  . Augmentin Rash  . Famvir (Famciclovir) Photosensitivity    Review of Systems  Review of Systems  Constitutional: Negative for fever and malaise/fatigue.  HENT: Negative for congestion.   Eyes: Negative for discharge.  Respiratory: Negative for shortness of breath.   Cardiovascular: Negative for chest pain, palpitations and leg swelling.  Gastrointestinal: Negative for nausea, abdominal pain, diarrhea, constipation, blood in stool and melena.  Genitourinary: Negative for dysuria, urgency, frequency and flank pain.  Musculoskeletal: Negative for falls.  Skin: Negative for rash.  Neurological: Negative for loss of consciousness and headaches.  Endo/Heme/Allergies: Negative for polydipsia.  Psychiatric/Behavioral: Negative for  depression and suicidal ideas. The patient is not nervous/anxious and does not have insomnia.     Objective  BP 117/79  Pulse 66  Temp(Src) 98.1 F (36.7 C) (Oral)  Ht 5' 1.5" (1.562 m)  Wt 164 lb 1.9 oz (74.444 kg)  BMI 30.51 kg/m2  SpO2 97%  Physical Exam  Physical Exam  Constitutional: She is oriented to person, place, and time and well-developed, well-nourished, and in no distress. No distress.  HENT:  Head: Normocephalic and atraumatic.  Eyes: Conjunctivae are normal.  Neck: Neck supple. No thyromegaly present.  Cardiovascular: Normal rate, regular rhythm and normal heart sounds.   No murmur heard. Pulmonary/Chest: Effort normal and  breath sounds normal. She has no wheezes.  Abdominal: She exhibits no distension and no mass.  Musculoskeletal: She exhibits no edema.  Lymphadenopathy:    She has no cervical adenopathy.  Neurological: She is alert and oriented to person, place, and time.  Skin: Skin is warm and dry. No rash noted. She is not diaphoretic.  Psychiatric: Memory, affect and judgment normal.    Lab Results  Component Value Date   TSH 1.48 01/03/2011   Lab Results  Component Value Date   WBC 7.4 01/03/2011   HGB 13.7 01/03/2011   HCT 39.8 01/03/2011   MCV 94.4 01/03/2011   PLT 201.0 01/03/2011   Lab Results  Component Value Date   CREATININE 0.8 01/03/2011   BUN 11 01/03/2011   NA 135 01/03/2011   K 4.6 01/03/2011   CL 102 01/03/2011   CO2 25 01/03/2011   Lab Results  Component Value Date   ALT 26 01/03/2011   AST 24 01/03/2011   ALKPHOS 78 01/03/2011   BILITOT 0.5 01/03/2011   Lab Results  Component Value Date   CHOL 197 01/03/2011   Lab Results  Component Value Date   HDL 63.00 01/03/2011   Lab Results  Component Value Date   LDLCALC 125* 01/03/2011   Lab Results  Component Value Date   TRIG 44.0 01/03/2011   Lab Results  Component Value Date   CHOLHDL 3 01/03/2011     Assessment & Plan  HSV-1 infection Patient reports she feels this is finally improving. Her posterior lesion is still present but less uncomfortable, finish course of tid Valtrex and then drop to once daily, report any worsening of symptoms or concerns  Vaginitis Patient notes only scant discharge and denies any odor or color, only c/o some intermittent pruritus. Will rx with Fluconazole weekly for the next couple of weeks and avoid simple carbs, continue with probiotics, notify us if symptoms worsen or return

## 2011-02-07 NOTE — Assessment & Plan Note (Signed)
Patient notes only scant discharge and denies any odor or color, only c/o some intermittent pruritus. Will rx with Fluconazole weekly for the next couple of weeks and avoid simple carbs, continue with probiotics, notify us if symptoms worsen or return

## 2011-02-07 NOTE — Patient Instructions (Signed)
Monilial Vaginitis (Yeast Infections) Vaginitis in a soreness, swelling and redness (inflammation) of the vagina and vulva. Monilial vaginitis is not a sexually transmitted infection.  CAUSES Yeast vaginitis is caused by yeast (candida) that is normally found in your vagina. With a yeast infection, the candida has overgrown in number to a point that upsets the chemical balance. SYMPTOMS   White thick vaginal discharge.   Swelling, itching, redness and irritation of the vagina and possibly the lips of the vagina (vulva).   Burning or painful urination.   Painful intercourse.  DIAGNOSIS Things that may contribute to monilial vaginitis are:  Postmenopausal and virginal states.  Pregnancy.   Infections.   Being tired, sick or stressed, especially if you had monilial vaginitis in the past.   Diabetes. Good control will help lower the chance.   Birth control pills.  Tight fitting garments.   Using bubble bath, feminine sprays, douches or deodorant tampons.   Taking certain medications that kill germs (antibiotics).   Sporadic recurrence can occur if you become ill.   TREATMENT  Your caregiver will give you medication.  There are several kinds of anti monilial vaginal creams and suppositories specific for monilial vaginitis. For recurrent yeast infections, use a suppository or cream in the vagina 2 times a week, or as directed.   Anti-monilial or steroid cream for the itching or irritation of the vulva may also be used. Get your caregiver's permission.   Painting the vagina with methylene blue solution may help if the monilial cream does not work.   Eating yogurt may help prevent monilial vaginitis.  HOME CARE INSTRUCTIONS  Finish all medication as prescribed.   Do not have sex until treatment is completed or after your caregiver tells you it is okay.   Take warm sitz baths.   Do not douche.   Do not use tampons, especially scented ones.   Wear cotton underwear.    Avoid tight pants and panty hose.   Tell your sexual partner that you have a yeast infection. They should go to their caregiver if they have symptoms such as mild rash or itching.   Your sexual partner should be treated as well if your infection is difficult to eliminate.   Practice safer sex. Use condoms.   Some vaginal medications cause latex condoms to fail. Vaginal medications that harm condoms are:   Cleocin cream.   Butoconazole (Femstat).   Terconazole (Terazol) vaginal suppository.   Miconazole (Monistat) (may be purchased over the counter).  SEEK MEDICAL CARE IF:  You have a temperature by mouth above 104.   The infection is getting worse after 2 days of treatment.   The infection is not getting better after 3 days of treatment.   You develop blisters in or around your vagina.   You develop vaginal bleeding, and it is not your menstrual period.   You have pain when you urinate.   You develop intestinal problems.   You have pain with sexual intercourse.  Document Released: 05/18/2005 Document Re-Released: 11/02/2009 [employer] Patient Information 2011 [employer]. 

## 2011-02-14 ENCOUNTER — Telehealth: Payer: Self-pay

## 2011-02-14 NOTE — Telephone Encounter (Signed)
Pt informed and states she has enough meds

## 2011-02-14 NOTE — Telephone Encounter (Signed)
Left a message for pt to return my call. 

## 2011-02-14 NOTE — Telephone Encounter (Signed)
Pt states she had another breakout on Friday? Pt is wandering if she should stay on the 3 Valtrex instead of dropping to 1 daily? Please advise?

## 2011-02-14 NOTE — Telephone Encounter (Signed)
Sure, stay on tid for 7 more days, call her in a new rx for the extra to cover 7 days, #21 if she is going to run low

## 2011-02-17 ENCOUNTER — Ambulatory Visit (INDEPENDENT_AMBULATORY_CARE_PROVIDER_SITE_OTHER): Payer: 59 | Admitting: Family Medicine

## 2011-02-17 ENCOUNTER — Encounter: Payer: Self-pay | Admitting: Family Medicine

## 2011-02-17 DIAGNOSIS — B009 Herpesviral infection, unspecified: Secondary | ICD-10-CM

## 2011-02-17 DIAGNOSIS — N898 Other specified noninflammatory disorders of vagina: Secondary | ICD-10-CM

## 2011-02-17 DIAGNOSIS — N760 Acute vaginitis: Secondary | ICD-10-CM

## 2011-02-17 DIAGNOSIS — R5381 Other malaise: Secondary | ICD-10-CM

## 2011-02-17 DIAGNOSIS — R5383 Other fatigue: Secondary | ICD-10-CM

## 2011-02-17 NOTE — Assessment & Plan Note (Signed)
This has improved, no discharge today, no further meds but encouraged to try alternating two different probiotics namely Florastor and Align daily. Avoid simple carbs

## 2011-02-17 NOTE — Assessment & Plan Note (Signed)
Recurrent. The initial testing for these lesions was done at her OB/GYN will request these records today and lab work to evaluate HSV 1 versus HSV-2 IgG versus IgM. She will continue on her Valtrex. She is encouraged to get regular exercise eat well sleep well and help boost her immune response and we will also check an HIV test at today's visit

## 2011-02-17 NOTE — Progress Notes (Signed)
[name] [id number] 06-Sep-1970 02/17/2011      Progress Note-Follow Up  Subjective  Chief Complaint  Chief Complaint  Patient presents with  . Herpes Zoster    follow up    HPI  Patient is in today for followup on her HSV lesions. She was struggling with a posterior lesion less than she was seen was placed on Valtrex 3 times a day and nightly checks resolved and she was very well. In roughly a week ago the anterior lesion she had at the beginning of all this resurfaced. At that reappeared she had malaise myalgia and sore throat but those symptoms have resolved. At this time her vaginal discharge irritation and itching are all resolved she just has the anterior lesion which is very painful. No other acute symptoms such as chest pain, fevers, abdominal pain or GI symptoms are noted. 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 Her nares complaints noted to  Past Medical History  Diagnosis Date  . 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 Hand pain, left 01/03/2011  . Overweight 01/03/2011  . Right foot pain 01/03/2011  . Tinnitus, left 01/03/2011  . Vaginitis 02/02/2011    Past Surgical History  Procedure Date  . Cesarean section     X 4  . Inner ear surgery 1997  . Appendectomy 1997  . Tubal ligation     Family History  Problem Relation Age of Onset  . Hypertension Mother   . Heart disease Father     smoker  . Dementia Maternal Grandmother   . Heart disease Maternal Grandmother     s/p MI  . COPD Maternal Grandfather     smoker  . Cancer Maternal Grandfather     intestinal    History   Social History  . Marital Status: Married    Spouse Name: N/A    Number of Children: N/A  . Years of Education: N/A   Occupational History  . Not on file.   Social History Main Topics  . Smoking status: Never Smoker   . Smokeless tobacco: Never Used  .  Alcohol Use: No  . Drug Use: No  . Sexually Active: Yes -- Female partner(s)   Other Topics Concern  . Not on file   Social History Narrative  . No narrative on file    Current Outpatient Prescriptions on File Prior to Visit  Medication Sig Dispense Refill  . FENUGREEK PO Take 9 tablets by mouth daily.        . fish oil-omega-3 fatty acids 1000 MG capsule Take 2 g by mouth daily.        . fluconazole (DIFLUCAN) 150 MG tablet 1 tab po weekly x 2 weeks  2 tablet  1  . Prenat w/o A-FeCbn-DSS-FA-DHA (CITRANATAL HARMONY) 28-1-250 MG CAPS       . Probiotic Product (PROBIOTIC PO) Take 1 tablet by mouth daily.        . valACYclovir (VALTREX) 1000 MG tablet Take 1 tablet by mouth three times daily for 10 days, then take 1 tablet by mouth once daily  50 tablet  2    Allergies  Allergen Reactions  . Augmentin Rash  . Famvir (Famciclovir) Photosensitivity    Review of Systems  Review of Systems  Constitutional: Negative for fever and malaise/fatigue.  HENT: Negative for congestion.   Eyes: Negative for discharge.  Respiratory:  Negative for shortness of breath.   Cardiovascular: Negative for chest pain, palpitations and leg swelling.  Gastrointestinal: Negative for nausea, abdominal pain and diarrhea.  Genitourinary: Negative for dysuria.       Anterior vaginal has recurred. This was her initial lesion which had resolved but redeveloped in past week. The posterior lesion has resolved and she actually felt well for several days before this lesion reappeared. No vaginal discharge at present  Musculoskeletal: Negative for falls.  Skin: Negative for rash.  Neurological: Negative for loss of consciousness and headaches.  Endo/Heme/Allergies: Negative for polydipsia.  Psychiatric/Behavioral: Negative for depression and suicidal ideas. The patient is not nervous/anxious and does not have insomnia.     Objective  BP 94/62  Pulse 77  Temp(Src) 97.4 F (36.3 C) (Oral)  Ht 5' 1.5" (1.562 m)   Wt 166 lb (75.297 kg)  BMI 30.86 kg/m2  SpO2 96%  LMP 02/10/2011  Physical Exam  Physical Exam  Constitutional: She is oriented to person, place, and time and well-developed, well-nourished, and in no distress. No distress.  HENT:  Head: Normocephalic and atraumatic.  Eyes: Conjunctivae are normal.  Neck: Neck supple. No thyromegaly present.  Cardiovascular: Normal rate, regular rhythm and normal heart sounds.   No murmur heard. Pulmonary/Chest: Effort normal and breath sounds normal. She has no wheezes.  Abdominal: She exhibits no distension and no mass.  Musculoskeletal: She exhibits no edema.  Lymphadenopathy:    She has no cervical adenopathy.  Neurological: She is alert and oriented to person, place, and time.  Skin: Skin is warm and dry. No rash noted. She is not diaphoretic.  Psychiatric: Memory, affect and judgment normal.    Lab Results  Component Value Date   TSH 1.48 01/03/2011   Lab Results  Component Value Date   WBC 7.4 01/03/2011   HGB 13.7 01/03/2011   HCT 39.8 01/03/2011   MCV 94.4 01/03/2011   PLT 201.0 01/03/2011   Lab Results  Component Value Date   CREATININE 0.8 01/03/2011   BUN 11 01/03/2011   NA 135 01/03/2011   K 4.6 01/03/2011   CL 102 01/03/2011   CO2 25 01/03/2011   Lab Results  Component Value Date   ALT 26 01/03/2011   AST 24 01/03/2011   ALKPHOS 78 01/03/2011   BILITOT 0.5 01/03/2011   Lab Results  Component Value Date   CHOL 197 01/03/2011   Lab Results  Component Value Date   HDL 63.00 01/03/2011   Lab Results  Component Value Date   LDLCALC 125* 01/03/2011   Lab Results  Component Value Date   TRIG 44.0 01/03/2011   Lab Results  Component Value Date   CHOLHDL 3 01/03/2011     Assessment & Plan  HSV-1 infection Recurrent. The initial testing for these lesions was done at her OB/GYN will request these records today and lab work to evaluate HSV 1 versus HSV-2 IgG versus IgM. She will continue on her Valtrex. She is encouraged  to get regular exercise eat well sleep well and help boost her immune response and we will also check an HIV test at today's visit  Vaginitis This has improved, no discharge today, no further meds but encouraged to try alternating two different probiotics namely Florastor and Align daily. Avoid simple carbs

## 2011-02-17 NOTE — Patient Instructions (Addendum)
Herpes Simplex   Herpes simplex is generally classified as Type 1 or Type 2.  Type 1 is generally the type that is responsible for cold sores.  Type 2 is generally associated with sexually transmitted diseases.  We now know that most of the thoughts on these viruses are inaccurate.  We find that HSV1 is also present genitally and HSV2 can be present orally, but this will vary in different locations of the world.  Herpes simplex is usually detected by doing a culture. Blood tests are also available for this virus; however, the accuracy is often not as good.    PREPARATION FOR TEST: No preparation or fasting is necessary.   NORMAL FINDINGS:  No virus present  No HSV antigens or antibodies present   MEANING OF TEST Your caregiver will go over the test results with you and discuss the importance and meaning of your results, as well as treatment options and the need for additional tests if necessary.   OBTAINING THE TEST RESULTS It is your responsibility to obtain your test results.  Ask the lab or department performing the test when and how you will get your results.   **"Normal" ranges for lab values and other tests may vary among different laboratories and/or hospitals.  You should always check with your doctor after having lab work or other tests done to discuss the meaning of your test results and whether or not your values are considered "within normal limits".   Document Released: 09/10/2004  Document Re-Released: 07/21/2008 [facility] Patient Information 2011 [place], [place].   Add a brisk 10 minute walk to your daily routine  Consider starting a low dose OTC Zinc supplement, take every third day and alternate with a sustained release garlic tab/capsule taken every third day

## 2011-02-17 NOTE — H&P (Signed)
  [name], [name]          ACCOUNT NO.:  [id number]  MEDICAL RECORD NO.:  [id number]           PATIENT TYPE:  I  LOCATION:  [facility]                          FACILITY:  [facility]  PHYSICIAN:  Guy Sandifer. Henderson Cloud, M.D. DATE OF BIRTH:  [birth year]  DATE OF ADMISSION:  10/14/2010 DATE OF DISCHARGE:                             HISTORY & PHYSICAL   CHIEF COMPLAINT:  Previous cesarean section x3, desires repeat.  HISTORY OF PRESENT ILLNESS:  The patient is a 40 year old married white female, G8, P3, whose pregnancy has been complicated by 1. Three previous cesarean sections. 2. 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 3. Advanced maternal age. 4. A notation of choroid plexus cyst and echogenic fetal bowel early     in this pregnancy.  The patient was evaluated by Maternal Fetal     Medicine.  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  PCR     studies for CMV, toxoplasmosis, and HSV were negative.  The patient     had a previous negative cystic fibrosis screen.  Subsequent     ultrasounds have shown resolution of both the cord plexus cyst as     well as the echogenic bowel.  Ultrasound on September 29, 2010,     reveals a vertex presentation and AFI at 84th percentile with an     estimated fetal weight of 7 pounds 4 ounces at the 83rd percentile.     The patient is admitted for repeat cesarean section.  She also     requests bilateral tubal ligation.  Risks and complications of the     procedure as well as alternative contraception, permanence of tubal     ligation failure rate increased ectopic risk have been reviewed.  Past medical history, past surgical history, social history, and obstetric history:  See prenatal history and physical.  REVIEW OF SYSTEMS:  The patient has had some sciatic hip pain treated with Tylenol No. 3.  MEDICATIONS: 1. Tylenol No. 3 p.r.n. 2. Prenatal vitamins.  ALLERGIES:  AUGMENTIN.  PHYSICAL  EXAM:  VITAL SIGNS:  Height 5 feet 2 inches, weight 177.4 pounds, blood pressure 124/80. LUNGS:  Clear to auscultation. HEART:  Regular rate and rhythm. ABDOMEN:  Gravid, nontender. CERVIX:  Closed, thick, and high.  LABORATORY DATA:  Group B strep culture is negative.  Blood type is O+.  ASSESSMENT: 1. Intrauterine pregnancy at 39-1/2 weeks estimated gestational age. 2. Previous cesarean section x3. 3. Desires permanent sterilization.  PLAN:  Repeat cesarean section with bilateral tubal ligation.     Guy Sandifer Henderson Cloud, M.D.     JET/MEDQ  D:  10/13/2010  T:  10/13/2010  Job:  [id number]  Electronically Signed by Harold Hedge M.D. on 11/08/2010 09:13:19 AM

## 2011-02-18 ENCOUNTER — Telehealth: Payer: Self-pay

## 2011-02-18 DIAGNOSIS — B009 Herpesviral infection, unspecified: Secondary | ICD-10-CM

## 2011-02-18 LAB — HIV ANTIBODY (ROUTINE TESTING W REFLEX): HIV: NONREACTIVE

## 2011-02-18 LAB — HSV(HERPES SMPLX)ABS-I+II(IGG+IGM)-BLD: Herpes Simplex Vrs I&II-IgM Ab (EIA): 4.75 INDEX — ABNORMAL HIGH

## 2011-02-18 NOTE — Telephone Encounter (Signed)
Pt would like a referral/ name to an Infectious Disease Specialist

## 2011-02-18 NOTE — Telephone Encounter (Signed)
Patient requesting referral to Infectious Disease due to the painful and persistent nature of her HSV 1 genital lesions. We will arrange, please notify patient

## 2011-02-18 NOTE — Telephone Encounter (Signed)
Pt.notified

## 2011-03-03 ENCOUNTER — Encounter: Payer: Self-pay | Admitting: Family Medicine

## 2011-03-07 ENCOUNTER — Ambulatory Visit (INDEPENDENT_AMBULATORY_CARE_PROVIDER_SITE_OTHER): Payer: 59 | Admitting: Internal Medicine

## 2011-03-07 ENCOUNTER — Encounter: Payer: Self-pay | Admitting: Internal Medicine

## 2011-03-07 VITALS — BP 109/75 | HR 83 | Temp 98.2°F | Ht 61.0 in | Wt 166.0 lb

## 2011-03-07 DIAGNOSIS — B009 Herpesviral infection, unspecified: Secondary | ICD-10-CM

## 2011-03-07 NOTE — Assessment & Plan Note (Signed)
Though I don't at this time have a copy of the viral culture, it is reported as being positive for HSV.  I did explain to her that the viral culture is the gold standard and her serologies do suggest that infection is relatively recent, but serologies are not as sensitive.  She has been concerned with the constant recurrences and what can be done to alleviate the symptoms.  I discussed at length the utility of suppressive therapy with 500 mg or 1 gram of Valtrex daily, that it does often shorten the course and decreases the amount of breakouts that are noted.  I also explained that transmission can occur from herself to her partner without condom use.  I also discussed that often, over time, the frequency of outbreaks diminishes, though she likely can still shed, even if she doesn't notice the lesions.  I explained, that after time, if she remains symptom free and does not want to continue the suppression, she can trial without it, but that does not completely eliminate the risk of spread to her partner.     Therefore, at this time, she should continue with 1 gram daily in between outbreaks and treatment doses with outbreaks.  She has been tested for HIV and no other tests indicated at this time.

## 2011-03-07 NOTE — Progress Notes (Signed)
  Subjective:    Patient ID: MYKELL GENAO, female    DOB: 10/19/1970, 40 y.o.   MRN: [id number]  HPI here for concern for HSV-1 genital infection that has been nearly ongoing since April.  She has been on Valtrex at 1 gram TID essentially during that time.  She initially was diagnosed by her GYN physician with a viral culture by her report which was positive for herpes virus.  She also has had serology consistent with more early infection with a positive IgM and negative glycoprotein IgG.  At this time, she finally has relief and the lesion has dissappeared.  She is concerned though with the number of recurrences, that it is from another underlying problem and here for consultation.      Review of Systems  Constitutional: Negative.   HENT: Negative.   Eyes: Negative.   Respiratory: Negative.   Cardiovascular: Negative.   Gastrointestinal: Negative.   Genitourinary: Negative.   Musculoskeletal: Negative.   Skin: Negative.   Neurological: Negative.   Hematological: Negative.   Psychiatric/Behavioral: Negative.        Objective:   Physical Exam  Constitutional: She appears well-developed and well-nourished.  Cardiovascular: Normal rate, regular rhythm and normal heart sounds.   Lymphadenopathy:    She has no cervical adenopathy.  Psychiatric: She has a normal mood and affect. Her behavior is normal.          Assessment & Plan:

## 2011-03-10 ENCOUNTER — Ambulatory Visit: Payer: 59 | Admitting: Family Medicine

## 2011-03-14 ENCOUNTER — Other Ambulatory Visit: Payer: Self-pay | Admitting: Family Medicine

## 2011-03-14 ENCOUNTER — Telehealth: Payer: Self-pay | Admitting: *Deleted

## 2011-03-14 DIAGNOSIS — B009 Herpesviral infection, unspecified: Secondary | ICD-10-CM

## 2011-03-14 MED ORDER — FLUCONAZOLE 150 MG PO TABS: ORAL_TABLET | ORAL | Status: DC

## 2011-03-14 MED ORDER — VALACYCLOVIR HCL 1 G PO TABS: ORAL_TABLET | ORAL | Status: DC

## 2011-03-14 NOTE — Telephone Encounter (Signed)
OK to refill Valtex 100mg  po daily and may increase to tid x 7days if flare develops. Disp: 50, 2 rf. Can refill Fluconazole 150mg  po q week x 4 weeks, Disp# 4, 1 rf

## 2011-03-14 NOTE — Telephone Encounter (Signed)
I have attempted to contact this patient by phone with the following results: Advised pt refills called to pharmacy.

## 2011-03-14 NOTE — Telephone Encounter (Signed)
VM left by pt stating she has yeast infection again and she needs refill on valtrex.  RC to pt.  Yeast infection on breast, not vaginal.  Red and itchy around nipples, no fever.  Baby is not having symptoms of thrush.  Pt is having vaginal itching and thinks she may be having another outbreak.  Pt is out of refills on long term Valtrex RX. [facility].

## 2011-03-22 ENCOUNTER — Telehealth: Payer: Self-pay | Admitting: Family Medicine

## 2011-03-22 NOTE — Telephone Encounter (Signed)
Left eye itchy and red. Please advise

## 2011-03-22 NOTE — Telephone Encounter (Signed)
Patient states she caught pink eye from her children.  She would like to know if you can call her in something without her being seen.  If so [facility] uses [facility] [address] [place], [place].

## 2011-03-23 NOTE — Telephone Encounter (Signed)
Didn't send in RX per md

## 2011-03-23 NOTE — Telephone Encounter (Signed)
Called pt to see how her eye is doing and pt states she doesn't think that its pink eye and requested an office visit. I scheduled a visit for pt for 03-24-11.

## 2011-03-23 NOTE — Telephone Encounter (Signed)
Can have rx for Polytrim eye drops if her eye is still bothering her. Apply 2 drops to b/l eyes tid x 7 days, would place warm compress with gentle massage over eyes prior to applying drops

## 2011-03-24 ENCOUNTER — Ambulatory Visit (INDEPENDENT_AMBULATORY_CARE_PROVIDER_SITE_OTHER): Payer: 59 | Admitting: Family Medicine

## 2011-03-24 ENCOUNTER — Encounter: Payer: Self-pay | Admitting: Family Medicine

## 2011-03-24 VITALS — BP 113/76 | HR 76 | Temp 97.9°F | Ht 61.5 in | Wt 166.1 lb

## 2011-03-24 DIAGNOSIS — B009 Herpesviral infection, unspecified: Secondary | ICD-10-CM

## 2011-03-24 DIAGNOSIS — H1010 Acute atopic conjunctivitis, unspecified eye: Secondary | ICD-10-CM

## 2011-03-24 DIAGNOSIS — T7840XA Allergy, unspecified, initial encounter: Secondary | ICD-10-CM

## 2011-03-24 DIAGNOSIS — H1045 Other chronic allergic conjunctivitis: Secondary | ICD-10-CM

## 2011-03-24 MED ORDER — BUDESONIDE 32 MCG/ACT NA SUSP: 2.0000 | Freq: Every day | NASAL | Status: DC | PRN

## 2011-03-24 MED ORDER — LORATADINE 10 MG PO TABS: 10.0000 mg | ORAL_TABLET | Freq: Every day | ORAL | Status: DC | PRN

## 2011-03-24 NOTE — Patient Instructions (Signed)
Allergic Conjunctivitis The conjunctiva is a thin membrane that covers the visible white part of the eyeball and the underside of the eyelids. This membrane protects and lubricates the eye. The membrane has small blood vessels running through it that can normally be seen. When the conjunctiva becomes inflamed, the condition is called conjunctivitis. In response to the inflammation, the conjunctival blood vessels become swollen. The swelling results in redness in the normally white part of the eye. The blood vessels of this membrane also react when a person has allergies and is then called allergic conjunctivitis. This condition usually lasts for as long as the allergy persists. Allergic conjunctivitis cannot be passed to another person (non-contagious). The likelihood of bacterial infection is great and the cause is not likely due to allergies if the inflamed eye has:  A sticky discharge.  Discharge or sticking together of the lids in the morning.   Scaling or flaking of the eyelids where the eyelashes come out.   Red swollen eyelids.   CAUSES  Germs (bacteria).   Viruses.   Irritants such as foreign bodies.   Blunt injury.   Chemicals.   General allergic reactions.   Inflammation or serious diseases in the inside or the outside of the eye or the orbit (the boney cavity in which the eye sits) can cause a "red eye."  SYMPTOMS  Eye redness.   Tearing.   Itchy eyes.   Burning feeling in the eyes.   Clear drainage from the eye.   Allergic reaction due to pollens or ragweed sensitivity. Seasonal allergic conjunctivitis is frequent in the spring when pollens are in the air and in the fall.  DIAGNOSIS This condition, in its many forms, is usually diagnosed based on the history and an ophthalmological exam. It usually involves both eyes. If your eyes react at the same time every year, allergies may be the cause. While most "red eyes" are due to allergy or an infection, the role of an  eye (ophthalmological) exam is important. The exam can rule out serious diseases of the eye or orbit. TREATMENT  Non-antibiotic eye drops, ointments, or medications by mouth may be prescribed if the ophthalmologist is sure the conjunctivitis is due to allergies alone.   Over-the-counter drops and ointments for allergic symptoms should be used only after other causes of conjunctivitis have been ruled out, or as your caregiver suggests.  Medications by mouth are often prescribed if other allergy-related symptoms are present. If the ophthalmologist is sure that the conjunctivitis is due to allergies alone, treatment is normally limited to drops or ointments to reduce itching and burning. HOME CARE INSTRUCTIONS  Wash hands before and after applying drops or ointments, or touching the inflamed eye(s) or eyelids.   Stop using your soft contact lenses and throw them away. Use a new pair of lenses when recovery is complete. You should run through sterilizing cycles at least three times before use after complete recovery if the old soft contact lenses are to be used. Hard contact lenses should be stopped. They need to be thoroughly sterilized before use after recovery.   Do not let the eye dropper tip or ointment tube touch the eyelid when putting medicine in your eye.   Itching and burning eyes due to allergies is often relieved by using a cool cloth applied to closed eye(s).  SEEK MEDICAL CARE IF:   Your problems do not go away after two or three days of treatment.   Your lids are sticky (especially in  the morning when you wake up) or stick together.   Discharge develops. Antibiotics may be needed either as drops, ointment, or by mouth.   You have extreme light sensitivity.   An oral temperature above 104 develops.   Pain in or around the eye or any other visual symptom develops.  MAKE SURE YOU:   Understand these instructions.   Will watch your condition.   Will get help right away if  you are not doing well or get worse.  Document Released: 10/29/2002 Document Re-Released: 01/26/2010 [facility] Patient Information 2011 [place], [place].

## 2011-03-25 ENCOUNTER — Other Ambulatory Visit: Payer: Self-pay

## 2011-03-25 ENCOUNTER — Encounter: Payer: Self-pay | Admitting: Family Medicine

## 2011-03-25 DIAGNOSIS — H1010 Acute atopic conjunctivitis, unspecified eye: Secondary | ICD-10-CM | POA: Insufficient documentation

## 2011-03-25 MED ORDER — MOMETASONE FUROATE 50 MCG/ACT NA SUSP: 2.0000 | Freq: Every day | NASAL | Status: DC | PRN

## 2011-03-25 NOTE — Assessment & Plan Note (Signed)
Has been seen in consultation by an OB/GYN and had her treatment reviewed, she is currently on Valtrex 1 gm daily and she is noting the lesions seem to flare around her menses. She may consider keeping a log regarding symptoms and consider increasing to bid dosing the day prior to her menses for a couple days if her flares continue to be painful. She is reassured that this usually improves with time

## 2011-03-25 NOTE — Progress Notes (Signed)
[name] [id number] May 15, 1971 03/25/2011      Progress Note-Follow Up  Subjective  Chief Complaint  Chief Complaint  Patient presents with  . Eye Pain    left eye X 4 days    HPI  40 year old Caucasian female who is in today for evaluation of eye irritation. She is concerned because 2 of her children have been diagnosed with pinkeye recently. She symptoms for about 4 days. She describes as irritation in her left eye. She doesn't think it's largely allergic. She's not had any swelling, pain, discharge or matting of the eyes. Instead she's had some mild itching and irritation more notably in the left eye than the right eye. She also has ongoing nasal congestion and pruritus. No fevers, chills, headache, visual changes, ear pain, sore throat, cough, congestion, chest pain, palpitations, shortness of breath, GI or GU complaints.  Review of Systems  Constitutional: Negative for fever and malaise/fatigue.  HENT: Negative for congestion.   Eyes: Positive for redness. Negative for blurred vision, double vision, photophobia and discharge.       Mild irritation left eye x 4 days, itchy  Respiratory: Negative for shortness of breath.   Cardiovascular: Negative for chest pain, palpitations and leg swelling.  Gastrointestinal: Negative for nausea, abdominal pain and diarrhea.  Genitourinary: Negative for dysuria.  Musculoskeletal: Negative for falls.  Skin: Negative for rash.  Neurological: Negative for loss of consciousness and headaches.  Endo/Heme/Allergies: Negative for polydipsia.  Psychiatric/Behavioral: Negative for depression and suicidal ideas. The patient is not nervous/anxious and does not have insomnia.     Past Medical History  Diagnosis Date  . 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 Hand pain, left 01/03/2011  . Overweight 01/03/2011  . Right foot pain 01/03/2011  . Tinnitus, left 01/03/2011  . Vaginitis 02/02/2011  . Allergic conjunctivitis 03/25/2011     Past Surgical History  Procedure Date  . Cesarean section     X 4  . Inner ear surgery 1997  . Appendectomy 1997  . Tubal ligation     Family History  Problem Relation Age of Onset  . Hypertension Mother   . Heart disease Father     smoker  . Dementia Maternal Grandmother   . Heart disease Maternal Grandmother     s/p MI  . COPD Maternal Grandfather     smoker  . Cancer Maternal Grandfather     intestinal    History   Social History  . Marital Status: Married    Spouse Name: N/A    Number of Children: N/A  . Years of Education: N/A   Occupational History  . Not on file.   Social History Main Topics  . Smoking status: Never Smoker   . Smokeless tobacco: Never Used  . Alcohol Use: No  . Drug Use: No  . Sexually Active: Yes -- Female partner(s)   Other Topics Concern  . Not on file   Social History Narrative  . No narrative on file    Current Outpatient Prescriptions on File Prior to Visit  Medication Sig Dispense Refill  . FENUGREEK PO Take 9 tablets by mouth daily.        . fish oil-omega-3 fatty acids 1000 MG capsule Take 2 g by mouth daily.        . Prenat w/o A-FeCbn-DSS-FA-DHA (CITRANATAL HARMONY) 28-1-250 MG CAPS       . Probiotic Product (PROBIOTIC PO) Take 1 tablet by mouth daily.        Marland Kitchen  valACYclovir (VALTREX) 1000 MG tablet Take 1 tablet by mouth daily for 10 days, may increase to 1 tab daily for 7 days for flare  50 tablet  2  . fluconazole (DIFLUCAN) 150 MG tablet 1 tab by mouth weekly x 4 weeks  4 tablet  1  . nystatin (MYCOSTATIN) cream         Allergies  Allergen Reactions  . Augmentin Rash  . Famvir (Famciclovir) Photosensitivity    Review of Systems    Objective  BP 113/76  Pulse 76  Temp(Src) 97.9 F (36.6 C) (Oral)  Ht 5' 1.5" (1.562 m)  Wt 166 lb 1.9 oz (75.352 kg)  BMI 30.88 kg/m2  SpO2 97%  LMP 03/17/2011  Physical Exam  Physical Exam  Constitutional: She is oriented to person, place, and time and  well-developed, well-nourished, and in no distress. No distress.  HENT:  Head: Normocephalic and atraumatic.  Eyes: Conjunctivae and EOM are normal. Pupils are equal, round, and reactive to light. Right eye exhibits no discharge. Left eye exhibits no discharge. No scleral icterus.       conjunctivae injected b/l sclerae mildly injected on left  Neck: Neck supple. No thyromegaly present.  Cardiovascular: Normal rate, regular rhythm and normal heart sounds.   No murmur heard. Pulmonary/Chest: Effort normal and breath sounds normal. She has no wheezes.  Abdominal: She exhibits no distension and no mass.  Musculoskeletal: She exhibits no edema.  Lymphadenopathy:    She has no cervical adenopathy.  Neurological: She is alert and oriented to person, place, and time.  Skin: Skin is warm and dry. No rash noted. She is not diaphoretic.  Psychiatric: Memory, affect and judgment normal.    Lab Results  Component Value Date   TSH 1.48 01/03/2011   Lab Results  Component Value Date   WBC 7.4 01/03/2011   HGB 13.7 01/03/2011   HCT 39.8 01/03/2011   MCV 94.4 01/03/2011   PLT 201.0 01/03/2011   Lab Results  Component Value Date   CREATININE 0.8 01/03/2011   BUN 11 01/03/2011   NA 135 01/03/2011   K 4.6 01/03/2011   CL 102 01/03/2011   CO2 25 01/03/2011   Lab Results  Component Value Date   ALT 26 01/03/2011   AST 24 01/03/2011   ALKPHOS 78 01/03/2011   BILITOT 0.5 01/03/2011   Lab Results  Component Value Date   CHOL 197 01/03/2011   Lab Results  Component Value Date   HDL 63.00 01/03/2011   Lab Results  Component Value Date   LDLCALC 125* 01/03/2011   Lab Results  Component Value Date   TRIG 44.0 01/03/2011   Lab Results  Component Value Date   CHOLHDL 3 01/03/2011     Assessment & Plan  Allergic conjunctivitis Patient is noting some irritation in the left eye with itching, no discharge or visual changes. Patient with history of allergies, will have her start Loratadine daily and a  nasal antihistamine daily, notify us if symptoms worsen  HSV-1 infection Has been seen in consultation by an OB/GYN and had her treatment reviewed, she is currently on Valtrex 1 gm daily and she is noting the lesions seem to flare around her menses. She may consider keeping a log regarding symptoms and consider increasing to bid dosing the day prior to her menses for a couple days if her flares continue to be painful. She is reassured that this usually improves with time

## 2011-03-25 NOTE — Assessment & Plan Note (Signed)
Patient is noting some irritation in the left eye with itching, no discharge or visual changes. Patient with history of allergies, will have her start Loratadine daily and a nasal antihistamine daily, notify us if symptoms worsen

## 2011-05-12 LAB — CBC
HCT: 30.5 — ABNORMAL LOW
HCT: 37.6
Hemoglobin: 12.8
MCHC: 34.1
MCHC: 34.6
MCV: 99.3
MCV: 99.4
Platelets: 134 — ABNORMAL LOW
Platelets: 165
RDW: 14.2
WBC: 8.8

## 2011-06-08 ENCOUNTER — Telehealth: Payer: Self-pay

## 2011-06-08 MED ORDER — AZITHROMYCIN 250 MG PO TABS: ORAL_TABLET | ORAL | Status: DC

## 2011-06-08 MED ORDER — VALACYCLOVIR HCL 500 MG PO TABS: 500.0000 mg | ORAL_TABLET | Freq: Every day | ORAL | Status: DC

## 2011-06-08 NOTE — Telephone Encounter (Signed)
RX sent and pt informed 

## 2011-06-08 NOTE — Telephone Encounter (Signed)
Please advise 

## 2011-06-08 NOTE — Telephone Encounter (Signed)
Sure if her lesions have finally calmed down we can drop her Valtrex to 500mg  tab daily, disp # 30, 5 rf

## 2011-06-08 NOTE — Telephone Encounter (Signed)
Addended by: Court Joy on: 06/08/2011 04:34 PM   Modules accepted: Orders

## 2011-06-08 NOTE — Telephone Encounter (Signed)
Pt called stating she has a sinus infection and would like an RX sent to her pharmacy? Pt stated her "head feels like its going to explode? Please advise?

## 2011-06-08 NOTE — Telephone Encounter (Signed)
OK to give a course of Azithromycin and have her stat some Mucinex and probiotics as well. Azithromycin 250mg  tabs 2 tabs po once and then 1 tab po qd x 4 days. Mucinex 600mg  po bid x 10 days

## 2011-06-08 NOTE — Telephone Encounter (Signed)
Pt states she went to urgent care. Pt also would like to know if she can drop her Valtrex to 500 mg and if so can an RX be sent to pharmacy

## 2011-06-09 ENCOUNTER — Encounter: Payer: Self-pay | Admitting: Family Medicine

## 2011-06-09 ENCOUNTER — Ambulatory Visit (INDEPENDENT_AMBULATORY_CARE_PROVIDER_SITE_OTHER): Payer: 59 | Admitting: Family Medicine

## 2011-06-09 VITALS — BP 100/64 | HR 74 | Temp 98.3°F | Wt 166.0 lb

## 2011-06-09 DIAGNOSIS — J019 Acute sinusitis, unspecified: Secondary | ICD-10-CM | POA: Insufficient documentation

## 2011-06-09 DIAGNOSIS — T887XXA Unspecified adverse effect of drug or medicament, initial encounter: Secondary | ICD-10-CM

## 2011-06-09 DIAGNOSIS — T50905A Adverse effect of unspecified drugs, medicaments and biological substances, initial encounter: Secondary | ICD-10-CM | POA: Insufficient documentation

## 2011-06-09 MED ORDER — SULFAMETHOXAZOLE-TRIMETHOPRIM 800-160 MG PO TABS: 1.0000 | ORAL_TABLET | Freq: Two times a day (BID) | ORAL | Status: AC

## 2011-06-09 NOTE — Assessment & Plan Note (Signed)
Focal erythema on right forearm consistently (this is the 3rd time) after taking penicillin med. D/C amoxil and put penicillins on allergy list.

## 2011-06-09 NOTE — Progress Notes (Signed)
OFFICE NOTE  06/09/2011  CC:  Chief Complaint  Patient presents with  . Allergic Reaction    to amoxicillin     HPI: Patient is a 40 y.o. Caucasian female who is here for rash on right arm and sinusitis. Has had 25mo of gradually worsening nasal congestion/runny nose, sneezing, PND with cough.  Last few days has developed burning pain in paranasal area diffusely, persistent headaches.  No fever, no wheezing or SOB. Went to an urgent care yesterday and was rx'd amoxil for sinusitis.  This was the only new med she had taken yesterday (took this about 2 pm) and this morning she noted right forearm redness and itchy/burning feeling when she awoke.  This is the exact same reaction she had to augmentin TWICE in the past. No sensation of throat swelling, no toungue swelling, no other rash, no hives, no wheezing or SOB. She is breast feeding her baby.  Pertinent PMH:  Focal right forearm rash after taking augmentin in the past x 2.  Pertinent Meds: Amoxil yesterday at 2 pm, valtrex qd, claritin  PE: Blood pressure 100/64, pulse 74, temperature 98.3 F (36.8 C), temperature source Oral, weight 166 lb (75.297 kg), last menstrual period 05/19/2011, SpO2 97.00%. VS: noted--normal. Gen: alert, NAD, NONTOXIC APPEARING. HEENT: eyes without injection, drainage, or swelling.  Ears: EACs clear, TMs with normal light reflex and landmarks.  Nose: Clear rhinorrhea, with some dried, crusty exudate adherent to mildly injected mucosa.  No purulent d/c.  Diffuse paranasal sinus TTP.  No facial swelling.  Throat and mouth without focal lesion.  No pharyngial swelling, erythema, or exudate.   Neck: supple, no LAD.   LUNGS: CTA bilat, nonlabored resps.   CV: RRR, no m/r/g. EXT: no c/c/e SKIN:right forearm with generalized erythema on volar aspect only (from elbow to wrist), blanchable.  NO hives.  No joint swelling.  Nontender to palpation.   IMPRESSION AND PLAN: Sinusitis acute Encouraged aggressive  nasal saline use. D/C amoxil. Start bactrim DS 1 bid x 10d (safe in breast feeding + no cross reactivity potential with penicillins)  Drug reaction Focal erythema on right forearm consistently (this is the 3rd time) after taking penicillin med. D/C amoxil and put penicillins on allergy list.      FOLLOW UP: prn

## 2011-06-09 NOTE — Assessment & Plan Note (Signed)
Encouraged aggressive nasal saline use. D/C amoxil. Start bactrim DS 1 bid x 10d (safe in breast feeding + no cross reactivity potential with penicillins)

## 2011-08-03 IMAGING — US US OB FOLLOW-UP
1 of 2 series · 14 of 28 positions shown · non-contrast
Comparison: none

[Series 1: us ob follow-up · 41 acquisitions, 14 frames shown]
[im 1/41]
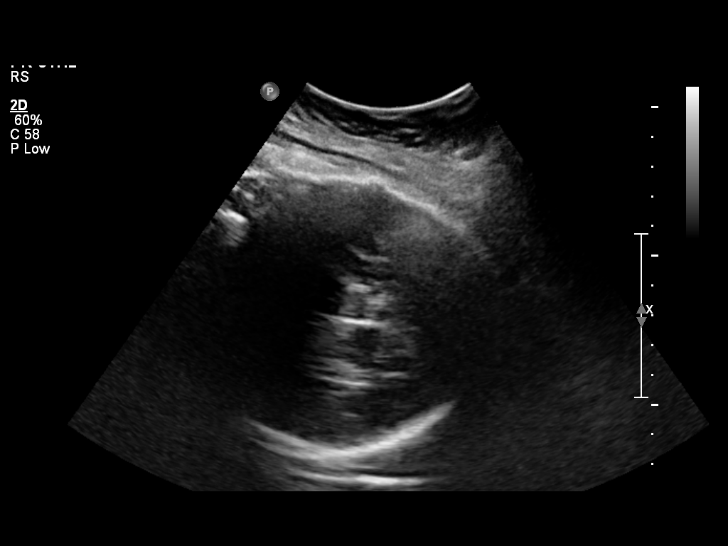
[im 4/41]
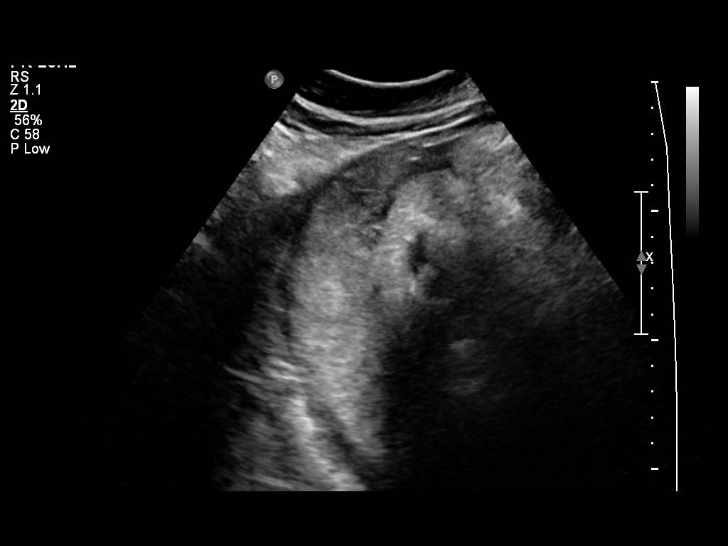
[im 7/41]
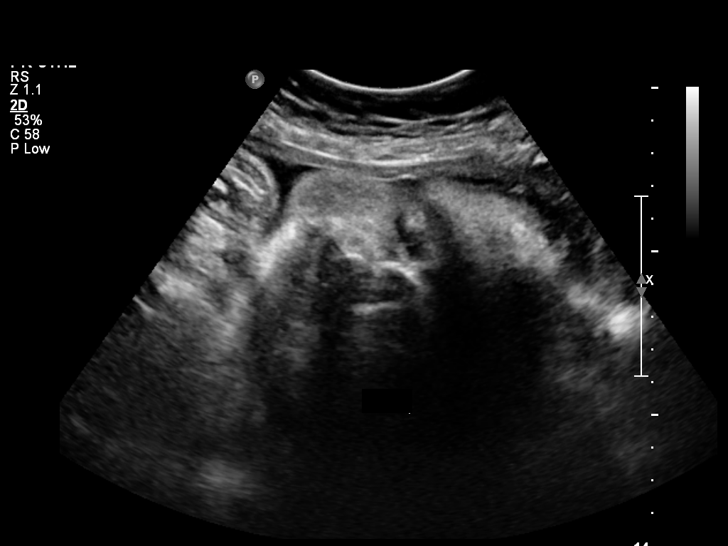
[im 10/41]
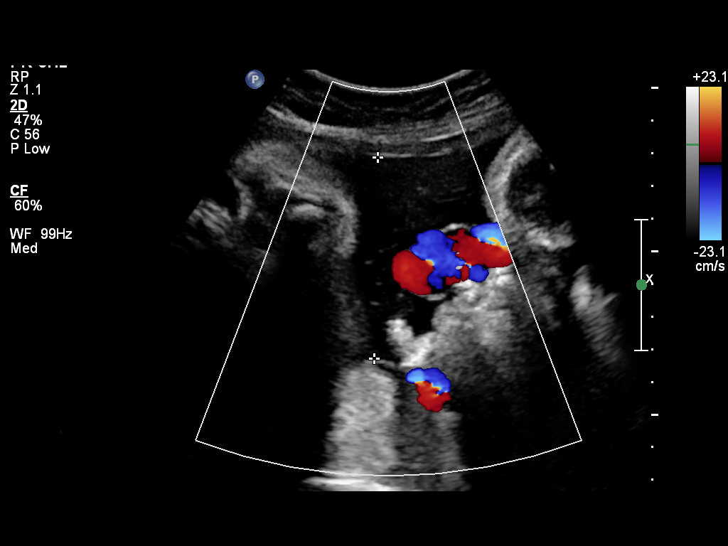
[im 13/41]
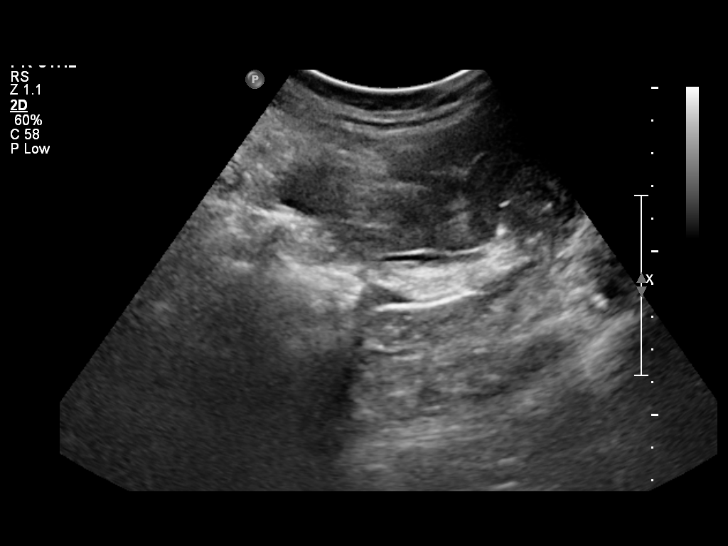
[im 16/41]
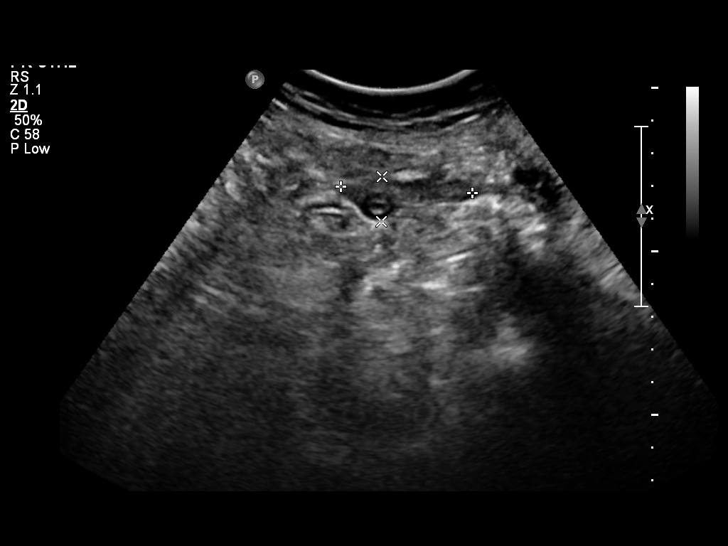
[im 19/41]
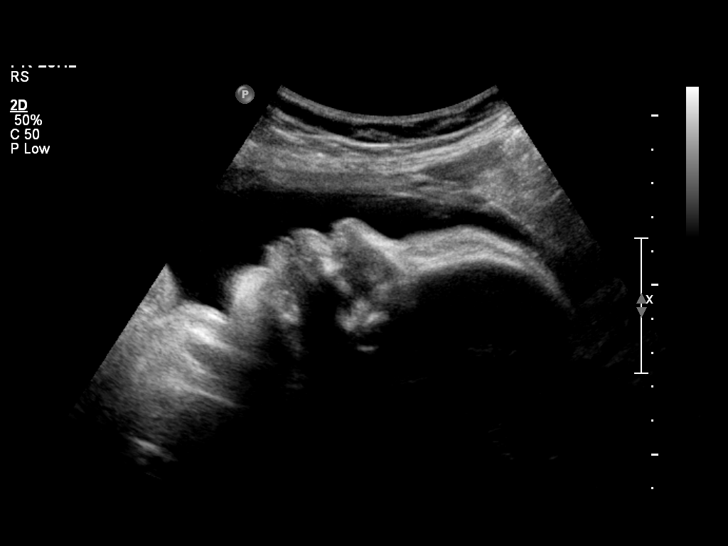
[im 22/41]
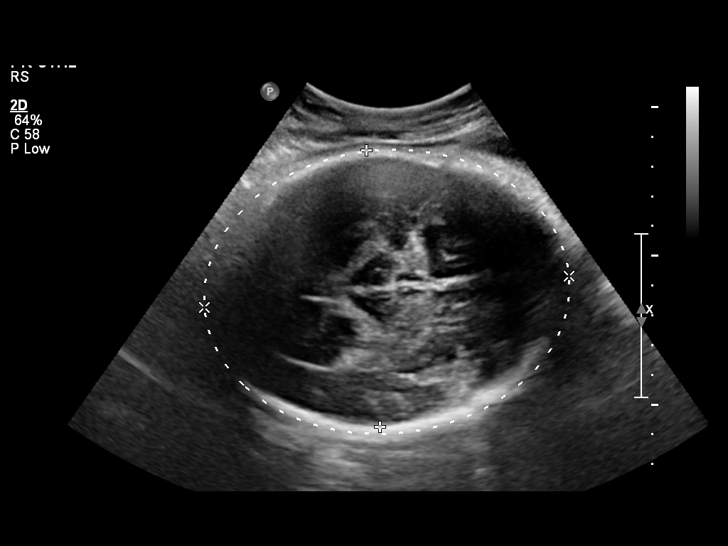
[im 25/41]
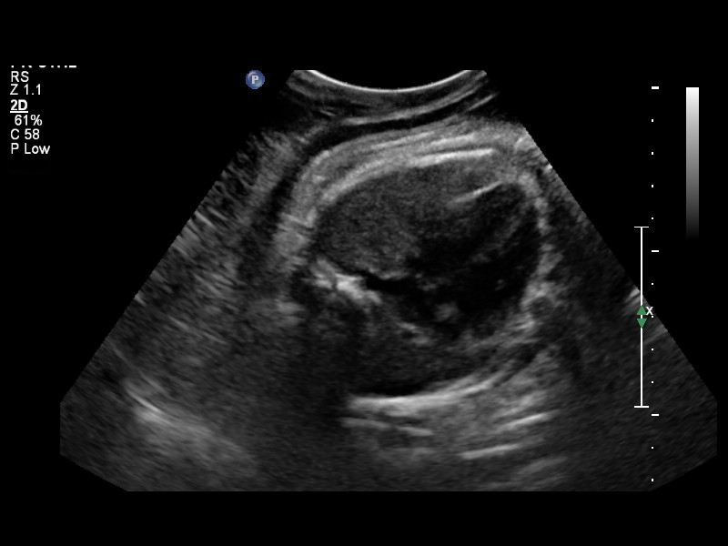
[im 28/41]
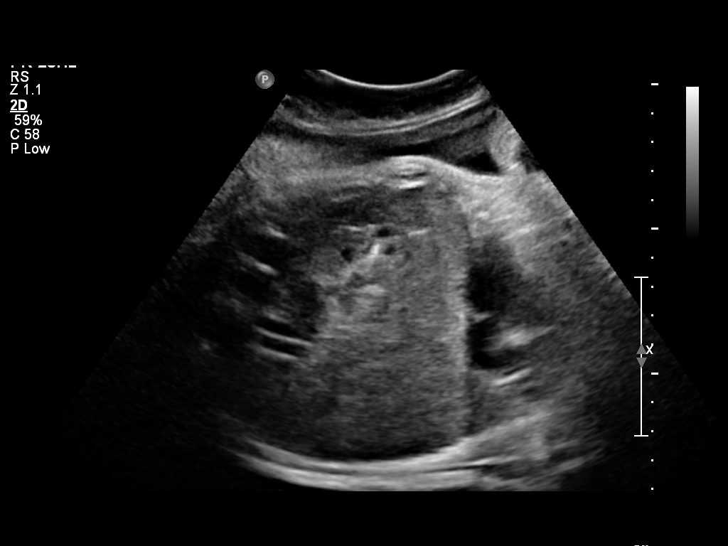
[im 31/41]
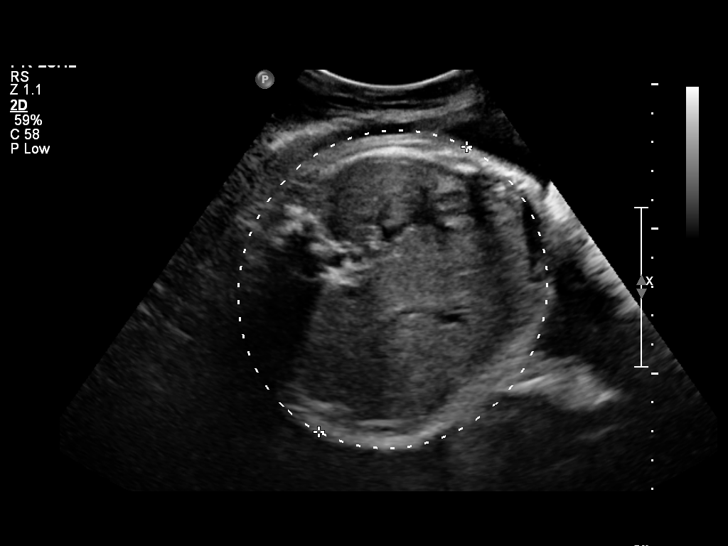
[im 34/41]
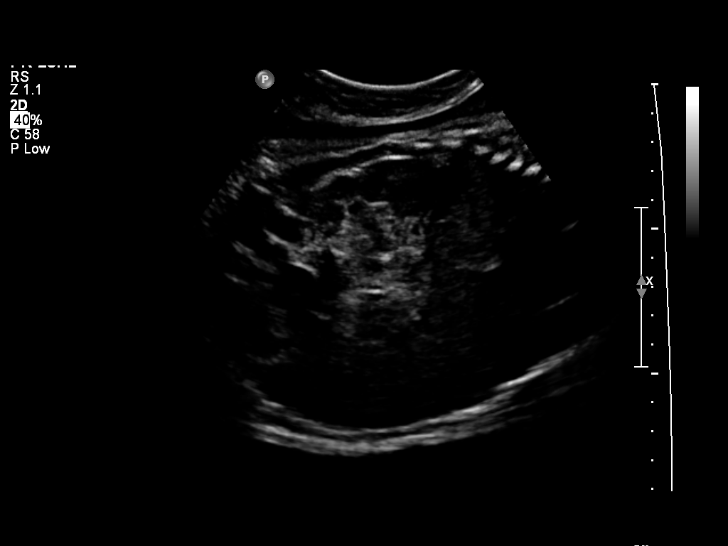
[im 37/41]
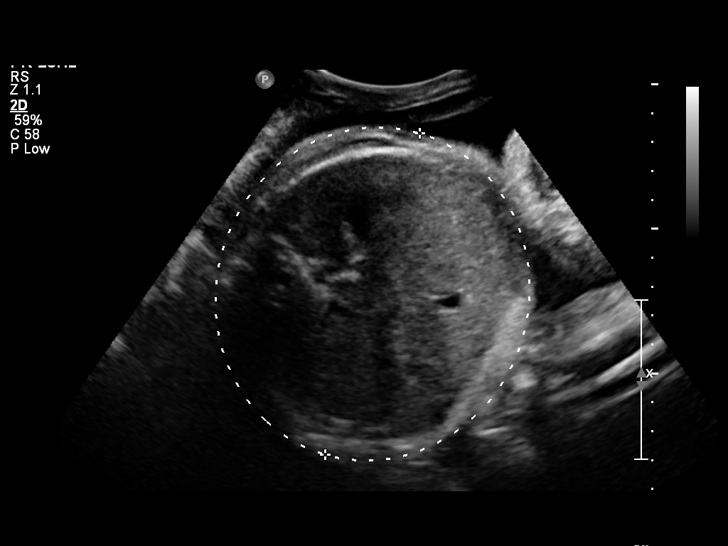
[im 41/41]
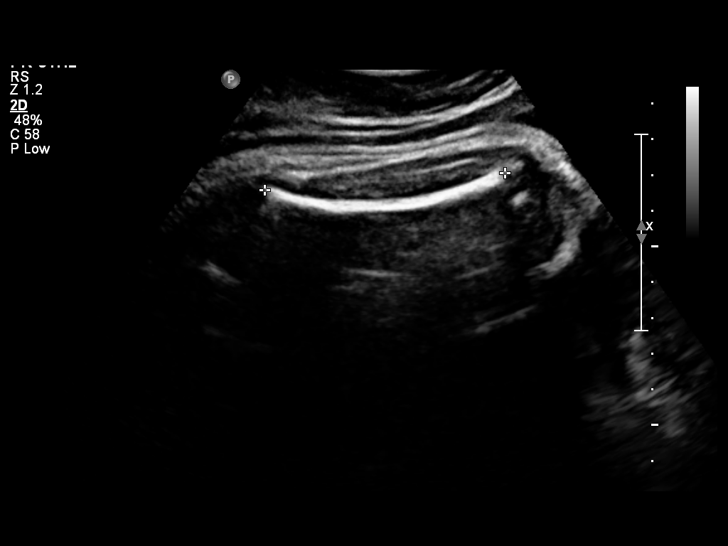

[14 of 28 positions shown; findings below may reference images not displayed]

Canned report from images found in remote index.

Refer to host system for actual result text.

## 2011-09-15 ENCOUNTER — Encounter: Payer: Self-pay | Admitting: Family Medicine

## 2011-09-15 ENCOUNTER — Ambulatory Visit (INDEPENDENT_AMBULATORY_CARE_PROVIDER_SITE_OTHER): Payer: 59 | Admitting: Family Medicine

## 2011-09-15 DIAGNOSIS — R202 Paresthesia of skin: Secondary | ICD-10-CM

## 2011-09-15 DIAGNOSIS — H6690 Otitis media, unspecified, unspecified ear: Secondary | ICD-10-CM

## 2011-09-15 DIAGNOSIS — J069 Acute upper respiratory infection, unspecified: Secondary | ICD-10-CM

## 2011-09-15 DIAGNOSIS — D649 Anemia, unspecified: Secondary | ICD-10-CM

## 2011-09-15 DIAGNOSIS — R209 Unspecified disturbances of skin sensation: Secondary | ICD-10-CM

## 2011-09-15 DIAGNOSIS — Z23 Encounter for immunization: Secondary | ICD-10-CM

## 2011-09-15 LAB — CBC
HCT: 36.3 % (ref 36.0–46.0)
Hemoglobin: 12.2 g/dL (ref 12.0–15.0)
MCHC: 33.7 g/dL (ref 30.0–36.0)
RDW: 13.2 % (ref 11.5–14.6)
WBC: 8.2 10*3/uL (ref 4.5–10.5)

## 2011-09-15 LAB — SEDIMENTATION RATE: Sed Rate: 29 mm/hr — ABNORMAL HIGH (ref 0–22)

## 2011-09-15 LAB — FOLATE: Folate: 16.4 ng/mL (ref >5.9)

## 2011-09-15 MED ORDER — AZITHROMYCIN 250 MG PO TABS: ORAL_TABLET | ORAL | Status: DC

## 2011-09-15 MED ORDER — AZITHROMYCIN 250 MG PO TABS: ORAL_TABLET | ORAL | Status: AC

## 2011-09-15 NOTE — Progress Notes (Signed)
Patient ID: [name], female   DOB: 1970-08-30, 41 y.o.   MRN: [id number] [name] [id number] [birth year] 09/15/2011      Progress Note-Follow Up  Subjective  Chief Complaint  No chief complaint on file.   HPI  Patient is a 41 year old caucasian female who is in today complaining of some uncomfortable paresthesias in her right leg, right leg, as well as some more fleeting discomfort in hands. She has had some congestion and cold symptoms recently but not severe, no fevers, chills, CP, palp, SOB, GI or GU c/o.  Past Medical History  Diagnosis Date  . 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 Hand pain, left 01/03/2011  . Overweight 01/03/2011  . Right foot pain 01/03/2011  . Tinnitus, left 01/03/2011  . Vaginitis 02/02/2011  . Allergic conjunctivitis 03/25/2011    Past Surgical History  Procedure Date  . Cesarean section     X 4  . Inner ear surgery 1997  . Appendectomy 1997  . Tubal ligation     Family History  Problem Relation Age of Onset  . Hypertension Mother   . Heart disease Father     smoker  . Dementia Maternal Grandmother   . Heart disease Maternal Grandmother     s/p MI  . COPD Maternal Grandfather     smoker  . Cancer Maternal Grandfather     intestinal    History   Social History  . Marital Status: Married    Spouse Name: N/A    Number of Children: N/A  . Years of Education: N/A   Occupational History  . Not on file.   Social History Main Topics  . Smoking status: Never Smoker   . Smokeless tobacco: Never Used  . Alcohol Use: No  . Drug Use: No  . Sexually Active: Yes -- Female partner(s)   Other Topics Concern  . Not on file   Social History Narrative  . No narrative on file    Current Outpatient Prescriptions on File Prior to Visit  Medication Sig Dispense Refill  . FENUGREEK PO Take 9 tablets by mouth daily.        . fish oil-omega-3 fatty acids 1000 MG capsule Take 2 g by mouth daily.        Marland Kitchen  loratadine (CLARITIN) 10 MG tablet Take 1 tablet (10 mg total) by mouth daily as needed for allergies.  30 tablet  2  . Prenat w/o A-FeCbn-DSS-FA-DHA (CITRANATAL HARMONY) 28-1-250 MG CAPS       . Probiotic Product (PROBIOTIC PO) Take 1 tablet by mouth daily.        . valACYclovir (VALTREX) 500 MG tablet Take 1 tablet (500 mg total) by mouth daily.  30 tablet  5    Allergies  Allergen Reactions  . Augmentin Rash  . Famvir (Famciclovir) Photosensitivity  . Penicillins Rash    Right forearm erythema on volar aspect only    Review of Systems  Review of Systems  Constitutional: Negative for fever and malaise/fatigue.  HENT: Negative for congestion.   Eyes: Negative for discharge.  Respiratory: Negative for shortness of breath.   Cardiovascular: Negative for chest pain, palpitations and leg swelling.  Gastrointestinal: Negative for nausea, abdominal pain and diarrhea.  Genitourinary: Negative for dysuria.  Musculoskeletal: Negative for falls.  Skin: Negative for rash.  Neurological: Negative for loss of consciousness and headaches.  Endo/Heme/Allergies: Negative for polydipsia.  Psychiatric/Behavioral: Negative for depression and suicidal ideas. The patient  is not nervous/anxious and does not have insomnia.     Objective  There were no vitals taken for this visit.  Physical Exam  Physical Exam  Constitutional: She is oriented to person, place, and time and well-developed, well-nourished, and in no distress. No distress.  HENT:  Head: Normocephalic and atraumatic.  Eyes: Conjunctivae are normal.  Neck: Neck supple. No thyromegaly present.  Cardiovascular: Normal rate, regular rhythm and normal heart sounds.   No murmur heard. Pulmonary/Chest: Effort normal and breath sounds normal. She has no wheezes.  Abdominal: She exhibits no distension and no mass.  Musculoskeletal: She exhibits no edema.  Lymphadenopathy:    She has no cervical adenopathy.  Neurological: She is alert  and oriented to person, place, and time.  Skin: Skin is warm and dry. No rash noted. She is not diaphoretic.  Psychiatric: Memory, affect and judgment normal.    Lab Results  Component Value Date   TSH 1.48 01/03/2011   Lab Results  Component Value Date   WBC 7.4 01/03/2011   HGB 13.7 01/03/2011   HCT 39.8 01/03/2011   MCV 94.4 01/03/2011   PLT 201.0 01/03/2011   Lab Results  Component Value Date   CREATININE 0.8 01/03/2011   BUN 11 01/03/2011   NA 135 01/03/2011   K 4.6 01/03/2011   CL 102 01/03/2011   CO2 25 01/03/2011   Lab Results  Component Value Date   ALT 26 01/03/2011   AST 24 01/03/2011   ALKPHOS 78 01/03/2011   BILITOT 0.5 01/03/2011   Lab Results  Component Value Date   CHOL 197 01/03/2011   Lab Results  Component Value Date   HDL 63.00 01/03/2011   Lab Results  Component Value Date   LDLCALC 125* 01/03/2011   Lab Results  Component Value Date   TRIG 44.0 01/03/2011   Lab Results  Component Value Date   CHOLHDL 3 01/03/2011     Assessment & Plan   URI (upper respiratory infection) Recent congestion is improving, increase rest and hydration  Paresthesias Describes a tingling sensation in her right arm, right leg, intermittently. No difference with change in position. Likely [ost viral but consider some deficiencies, check some labs. Report worsening or persistent symptoms.

## 2011-09-15 NOTE — Patient Instructions (Signed)

## 2011-09-18 ENCOUNTER — Encounter: Payer: Self-pay | Admitting: Family Medicine

## 2011-09-18 DIAGNOSIS — J069 Acute upper respiratory infection, unspecified: Secondary | ICD-10-CM | POA: Insufficient documentation

## 2011-09-18 DIAGNOSIS — R202 Paresthesia of skin: Secondary | ICD-10-CM | POA: Insufficient documentation

## 2011-09-18 NOTE — Assessment & Plan Note (Signed)
Recent congestion is improving, increase rest and hydration

## 2011-09-18 NOTE — Assessment & Plan Note (Addendum)
Describes a tingling sensation in her right arm, right leg, intermittently. No difference with change in position. Likely [ost viral but consider some deficiencies, check some labs. Report worsening or persistent symptoms.

## 2011-12-20 ENCOUNTER — Ambulatory Visit (INDEPENDENT_AMBULATORY_CARE_PROVIDER_SITE_OTHER): Payer: 59 | Admitting: Family Medicine

## 2011-12-20 DIAGNOSIS — E663 Overweight: Secondary | ICD-10-CM

## 2011-12-20 DIAGNOSIS — Z0289 Encounter for other administrative examinations: Secondary | ICD-10-CM

## 2011-12-22 NOTE — Progress Notes (Signed)
Patient did not attend her appt

## 2012-03-28 ENCOUNTER — Telehealth: Payer: Self-pay

## 2012-03-28 ENCOUNTER — Other Ambulatory Visit: Payer: Self-pay

## 2012-03-28 MED ORDER — ALBENDAZOLE 200 MG PO TABS: 400.0000 mg | ORAL_TABLET | Freq: Every day | ORAL | Status: DC

## 2012-03-28 NOTE — Telephone Encounter (Signed)
OK to treat with Mebendazole 100 mg tab 1 tab po now and repeat in 2 weeks, disp #2 tabs, no refills

## 2012-03-28 NOTE — Telephone Encounter (Signed)
Pt called into to [facility] stating her kid has pin worms and the pediatrician won't give her an RX? Please advise?

## 2012-03-28 NOTE — Telephone Encounter (Signed)
Pharmacy unable to get Mebendazole, will rx Albendazole 400 mg tab 1 tab po once disp # 1, have them call if any concerning symptoms occur or child has recurrence of pinworm for retreatment

## 2012-04-05 ENCOUNTER — Encounter: Payer: Self-pay | Admitting: Family Medicine

## 2012-04-05 ENCOUNTER — Ambulatory Visit (INDEPENDENT_AMBULATORY_CARE_PROVIDER_SITE_OTHER): Payer: 59 | Admitting: Family Medicine

## 2012-04-05 VITALS — BP 124/84 | HR 95 | Temp 97.4°F | Ht 61.5 in | Wt 160.1 lb

## 2012-04-05 DIAGNOSIS — L290 Pruritus ani: Secondary | ICD-10-CM

## 2012-04-05 DIAGNOSIS — K644 Residual hemorrhoidal skin tags: Secondary | ICD-10-CM

## 2012-04-05 MED ORDER — HYDROCORTISONE ACETATE 25 MG RE SUPP: 25.0000 mg | Freq: Two times a day (BID) | RECTAL | Status: AC

## 2012-04-05 NOTE — Addendum Note (Signed)
Addended by: Court Joy on: 04/05/2012 01:13 PM   Modules accepted: Orders

## 2012-04-05 NOTE — Addendum Note (Signed)
Addended by: Court Joy on: 04/05/2012 11:04 AM   Modules accepted: Orders

## 2012-04-05 NOTE — Progress Notes (Signed)
OFFICE NOTE  04/05/2012  CC:  Chief Complaint  Patient presents with  . Hemorrhoids    painful and itchy     HPI: Patient is a 41 y.o. Caucasian female who is here for "hemorrhoids painful and itchy". Describes rectal itchiness x 10d or so, minimal discomfort or pain.  No bleeding.   +Hx of large, hard BMs.  + hx of hemorrhoids during pregnancies.  Prep H bid x 1 wk of minimal help. Heat makes it feel worse.  Child had pinworms recently and she took OTC pyrantel pamoate x 1 dose although she was asymptomatic at that time.   Pertinent PMH:  Past Medical History  Diagnosis Date  . 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 Hand pain, left 01/03/2011  . Overweight 01/03/2011  . Right foot pain 01/03/2011  . Tinnitus, left 01/03/2011  . Vaginitis 02/02/2011  . Allergic conjunctivitis 03/25/2011  . Paresthesias 09/18/2011    MEDS:  Outpatient Prescriptions Prior to Visit  Medication Sig Dispense Refill  . valACYclovir (VALTREX) 500 MG tablet Take 1 tablet (500 mg total) by mouth daily.  30 tablet  5  . albendazole (ALBENZA) 200 MG tablet Take 2 tablets (400 mg total) by mouth daily.  2 tablet  0    PE: Blood pressure 124/84, pulse 95, temperature 97.4 F (36.3 C), temperature source Temporal, height 5' 1.5" (1.562 m), weight 160 lb 1.9 oz (72.63 kg), last menstrual period 03/26/2012, SpO2 91.00%. Gen: Alert, well appearing.  Patient is oriented to person, place, time, and situation. Rectum: several small hemorrhoids visible, mild TTP, no thrombosed hemorrhoids, no fissure.  IMPRESSION AND PLAN:  External hemorrhoids With recent family member with pinworms I did a swoob test on her today, but she certainly has external hemorrhoids that are likely the source of her symptoms. Anusol HC suppositories rx'd.   Add fruit juice daily to diet, take senakot S 2 tabs per day to minimize hard/infrequent stool. Discussed what to call or return for.     FOLLOW UP: prn

## 2012-04-05 NOTE — Assessment & Plan Note (Signed)
With recent family member with pinworms I did a swoob test on her today, but she certainly has external hemorrhoids that are likely the source of her symptoms. Anusol HC suppositories rx'd.   Add fruit juice daily to diet, take senakot S 2 tabs per day to minimize hard/infrequent stool. Discussed what to call or return for.

## 2012-04-05 NOTE — Patient Instructions (Addendum)
Take senakot S (generic/store brand) and take 2 every day. Increase intake of apple, prune, or grape juice.

## 2012-04-06 NOTE — Progress Notes (Signed)
Quick Note:  Patient Informed and voiced understanding.  Pt wants MD to know she is feeling better also ______

## 2012-04-30 ENCOUNTER — Other Ambulatory Visit: Payer: Self-pay | Admitting: Obstetrics and Gynecology

## 2012-05-30 ENCOUNTER — Other Ambulatory Visit: Payer: Self-pay | Admitting: Obstetrics and Gynecology

## 2012-10-06 ENCOUNTER — Encounter (HOSPITAL_COMMUNITY): Payer: Self-pay

## 2012-10-06 ENCOUNTER — Inpatient Hospital Stay (HOSPITAL_COMMUNITY)
Admission: AD | Admit: 2012-10-06 | Discharge: 2012-10-06 | Disposition: A | Discharge status: Home / Self Care | Payer: 59 | Source: Ambulatory Visit | Attending: Obstetrics and Gynecology | Admitting: Obstetrics and Gynecology

## 2012-10-06 DIAGNOSIS — N898 Other specified noninflammatory disorders of vagina: Secondary | ICD-10-CM

## 2012-10-06 DIAGNOSIS — N760 Acute vaginitis: Secondary | ICD-10-CM | POA: Insufficient documentation

## 2012-10-06 DIAGNOSIS — N949 Unspecified condition associated with female genital organs and menstrual cycle: Secondary | ICD-10-CM | POA: Insufficient documentation

## 2012-10-06 DIAGNOSIS — A499 Bacterial infection, unspecified: Secondary | ICD-10-CM | POA: Insufficient documentation

## 2012-10-06 DIAGNOSIS — B9689 Other specified bacterial agents as the cause of diseases classified elsewhere: Secondary | ICD-10-CM | POA: Insufficient documentation

## 2012-10-06 LAB — WET PREP, GENITAL: Yeast Wet Prep HPF POC: NONE SEEN

## 2012-10-06 LAB — URINALYSIS, ROUTINE W REFLEX MICROSCOPIC
Bilirubin Urine: NEGATIVE
Glucose, UA: NEGATIVE mg/dL
Hgb urine dipstick: NEGATIVE
Specific Gravity, Urine: 1.025 (ref 1.005–1.030)

## 2012-10-06 LAB — POCT PREGNANCY, URINE: Preg Test, Ur: NEGATIVE

## 2012-10-06 MED ORDER — METRONIDAZOLE 500 MG PO TABS: 500.0000 mg | ORAL_TABLET | Freq: Two times a day (BID) | ORAL | Status: DC

## 2012-10-06 NOTE — MAU Provider Note (Signed)
Chief Complaint: Vaginal Discharge and Vaginal Itching   First Provider Initiated Contact with Patient 10/06/12 1545     SUBJECTIVE HPI: [name] is a 42 y.o. [id number] who presents to maternity admissions reporting vaginal irritation and thin vaginal discharge with odor.  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  She has treated her bacterial infections with topical medications and used topical treatment last night.  She reports that her vaginal infections and diagnoses are upsetting and have taken a lot of her time and energy in the last 2 years.  She has discussed preventive measures with her doctor, including probiotics and dietary changes.  She denies vaginal bleeding, urinary symptoms, h/a, dizziness, n/v, or fever/chills.     Past Medical History  Diagnosis Date  . 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 Hand pain, left 01/03/2011  . Overweight 01/03/2011  . Right foot pain 01/03/2011  . Tinnitus, left 01/03/2011  . Vaginitis 02/02/2011  . Allergic conjunctivitis 03/25/2011  . Paresthesias 09/18/2011   Past Surgical History  Procedure Laterality Date  . Cesarean section      X 4  . Inner ear surgery  1997  . Appendectomy  1997  . Tubal ligation     History   Social History  . Marital Status: Married    Spouse Name: N/A    Number of Children: N/A  . Years of Education: N/A   Occupational History  . Not on file.   Social History Main Topics  . Smoking status: Never Smoker   . Smokeless tobacco: Never Used  . Alcohol Use: No  . Drug Use: No  . Sexually Active: Yes -- Female partner(s)   Other Topics Concern  . Not on file   Social History Narrative  . No narrative on file   No current facility-administered medications on file prior to encounter.   No current outpatient prescriptions on file prior to encounter.   Allergies  Allergen Reactions  . Amoxicillin-Pot Clavulanate Rash  . Famvir  (Famciclovir) Photosensitivity  . Penicillins Rash    Right forearm erythema on volar aspect only    ROS: Pertinent items in HPI  OBJECTIVE Blood pressure 118/78, pulse 91, temperature 97.8 F (36.6 C), temperature source Oral, resp. rate 16. GENERAL: Well-developed, well-nourished female in no acute distress.  HEENT: Normocephalic HEART: normal rate RESP: normal effort ABDOMEN: Soft, non-tender EXTREMITIES: Nontender, no edema NEURO: Alert and oriented Pelvic exam: Cervix pink, visually closed, without lesion, large amount white cream noted externally and internally, vaginal walls and external genitalia normal Bimanual exam: Cervix 0/long/high, firm, anterior, neg CMT, uterus nontender, nonenlarged, adnexa without tenderness, enlargement, or mass  LAB RESULTS Results for orders placed during the hospital encounter of 10/06/12 (from the past 24 hour(s))  URINALYSIS, ROUTINE W REFLEX MICROSCOPIC     Status: Abnormal   Collection Time    10/06/12  3:15 PM      Result Value Range   Color, Urine YELLOW  YELLOW   APPearance CLEAR  CLEAR   Specific Gravity, Urine 1.025  1.005 - 1.030   pH 6.0  5.0 - 8.0   Glucose, UA NEGATIVE  NEGATIVE mg/dL   Hgb urine dipstick NEGATIVE  NEGATIVE   Bilirubin Urine NEGATIVE  NEGATIVE   Ketones, ur 15 (*) NEGATIVE mg/dL   Protein, ur NEGATIVE  NEGATIVE mg/dL   Urobilinogen, UA 0.2  0.0 - 1.0 mg/dL   Nitrite NEGATIVE  NEGATIVE   Leukocytes, UA NEGATIVE  NEGATIVE  POCT PREGNANCY, URINE     Status: None   Collection Time    10/06/12  3:23 PM      Result Value Range   Preg Test, Ur NEGATIVE  NEGATIVE  WET PREP, GENITAL     Status: Abnormal   Collection Time    10/06/12  3:30 PM      Result Value Range   Yeast Wet Prep HPF POC NONE SEEN  NONE SEEN   Trich, Wet Prep NONE SEEN  NONE SEEN   Clue Cells Wet Prep HPF POC MODERATE (*) NONE SEEN   WBC, Wet Prep HPF POC FEW (*) NONE SEEN    ASSESSMENT 1. BV (bacterial vaginosis)   2. Vaginal  irritation     PLAN Called Dr Henderson Cloud to discuss assessment and findings Discharge home Flagyl BID x7 days May use topical steroid cream pt has prescribed for external irritation Continue probiotics, healthy diet and lifestyle, consider relaxation techniques, stress management F/U with Dr Henderson Cloud Return to MAU as needed   Medication List    STOP taking these medications       clindamycin 2 % vaginal cream  Commonly known as:  CLEOCIN      TAKE these medications       metroNIDAZOLE 500 MG tablet  Commonly known as:  FLAGYL  Take 1 tablet (500 mg total) by mouth 2 (two) times daily.         Sharen Counter Certified Nurse-Midwife 10/06/2012  4:13 PM

## 2012-10-06 NOTE — MAU Note (Signed)
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

## 2012-10-07 LAB — GC/CHLAMYDIA PROBE AMP: CT Probe RNA: NEGATIVE

## 2013-07-16 ENCOUNTER — Ambulatory Visit (INDEPENDENT_AMBULATORY_CARE_PROVIDER_SITE_OTHER): Payer: 59 | Admitting: Family Medicine

## 2013-07-16 ENCOUNTER — Encounter: Payer: Self-pay | Admitting: Family Medicine

## 2013-07-16 VITALS — BP 104/80 | HR 80 | Temp 98.5°F | Ht 61.5 in | Wt 166.1 lb

## 2013-07-16 DIAGNOSIS — B009 Herpesviral infection, unspecified: Secondary | ICD-10-CM

## 2013-07-16 DIAGNOSIS — L259 Unspecified contact dermatitis, unspecified cause: Secondary | ICD-10-CM

## 2013-07-16 DIAGNOSIS — L309 Dermatitis, unspecified: Secondary | ICD-10-CM

## 2013-07-16 DIAGNOSIS — B349 Viral infection, unspecified: Secondary | ICD-10-CM

## 2013-07-16 DIAGNOSIS — B9789 Other viral agents as the cause of diseases classified elsewhere: Secondary | ICD-10-CM

## 2013-07-16 DIAGNOSIS — Z23 Encounter for immunization: Secondary | ICD-10-CM

## 2013-07-16 DIAGNOSIS — E785 Hyperlipidemia, unspecified: Secondary | ICD-10-CM

## 2013-07-16 LAB — CBC
MCH: 28.9 pg (ref 26.0–34.0)
MCHC: 33.7 g/dL (ref 30.0–36.0)
MCV: 85.8 fL (ref 78.0–100.0)
Platelets: 248 10*3/uL (ref 150–400)
RDW: 14.3 % (ref 11.5–15.5)

## 2013-07-16 LAB — LIPID PANEL
LDL Cholesterol: 130 mg/dL — ABNORMAL HIGH (ref 0–99)
Triglycerides: 70 mg/dL (ref <150)

## 2013-07-16 LAB — TSH: TSH: 1.578 u[IU]/mL (ref 0.350–4.500)

## 2013-07-16 LAB — RENAL FUNCTION PANEL
Albumin: 4.3 g/dL (ref 3.5–5.2)
Calcium: 9.2 mg/dL (ref 8.4–10.5)
Creat: 0.72 mg/dL (ref 0.50–1.10)
Phosphorus: 3.3 mg/dL (ref 2.3–4.6)

## 2013-07-16 LAB — HEPATIC FUNCTION PANEL
AST: 16 U/L (ref 0–37)
Albumin: 4.3 g/dL (ref 3.5–5.2)
Total Bilirubin: 0.3 mg/dL (ref 0.3–1.2)

## 2013-07-16 LAB — SEDIMENTATION RATE: Sed Rate: 13 mm/hr (ref 0–22)

## 2013-07-16 MED ORDER — FLUCONAZOLE 150 MG PO TABS: 150.0000 mg | ORAL_TABLET | ORAL | Status: DC

## 2013-07-16 NOTE — Patient Instructions (Addendum)
Calcium/magnesium/zinc tab daily Cleanse with witch hazel Astringent as needed   Candida Infection, Adult A candida infection (also called yeast, fungus and Monilia infection) is an overgrowth of yeast that can occur anywhere on the body. A yeast infection commonly occurs in warm, moist body areas. Usually, the infection remains localized but can spread to become a systemic infection. A yeast infection may be a sign of a more severe disease such as diabetes, leukemia, or AIDS. A yeast infection can occur in both men and women. In women, Candida vaginitis is a vaginal infection. It is one of the most common causes of vaginitis. Men usually do not have symptoms or know they have an infection until other problems develop. Men may find out they have a yeast infection because their sex partner has a yeast infection. Uncircumcised men are more likely to get a yeast infection than circumcised men. This is because the uncircumcised glans is not exposed to air and does not remain as dry as that of a circumcised glans. Older adults may develop yeast infections around dentures. CAUSES  Women  Antibiotics.  Steroid medication taken for a long time.  Being overweight (obese).  Diabetes.  Poor immune condition.  Certain serious medical conditions.  Immune suppressive medications for organ transplant patients.  Chemotherapy.  Pregnancy.  Menstration.  Stress and fatigue.  Intravenous drug use.  Oral contraceptives.  Wearing tight-fitting clothes in the crotch area.  Catching it from a sex partner who has a yeast infection.  Spermicide.  Intravenous, urinary, or other catheters. Men  Catching it from a sex partner who has a yeast infection.  Having oral or anal sex with a person who has the infection.  Spermicide.  Diabetes.  Antibiotics.  Poor immune system.  Medications that suppress the immune system.  Intravenous drug use.  Intravenous, urinary, or other  catheters. SYMPTOMS  Women  Thick, white vaginal discharge.  Vaginal itching.  Redness and swelling in and around the vagina.  Irritation of the lips of the vagina and perineum.  Blisters on the vaginal lips and perineum.  Painful sexual intercourse.  Low blood sugar (hypoglycemia).  Painful urination.  Bladder infections.  Intestinal problems such as constipation, indigestion, bad breath, bloating, increase in gas, diarrhea, or loose stools. Men  Men may develop intestinal problems such as constipation, indigestion, bad breath, bloating, increase in gas, diarrhea, or loose stools.  Dry, cracked skin on the penis with itching or discomfort.  Jock itch.  Dry, flaky skin.  Athlete's foot.  Hypoglycemia. DIAGNOSIS  Women  A history and an exam are performed.  The discharge may be examined under a microscope.  A culture may be taken of the discharge. Men  A history and an exam are performed.  Any discharge from the penis or areas of cracked skin will be looked at under the microscope and cultured.  Stool samples may be cultured. TREATMENT  Women  Vaginal antifungal suppositories and creams.  Medicated creams to decrease irritation and itching on the outside of the vagina.  Warm compresses to the perineal area to decrease swelling and discomfort.  Oral antifungal medications.  Medicated vaginal suppositories or cream for repeated or recurrent infections.  Wash and dry the irritation areas before applying the cream.  Eating yogurt with lactobacillus may help with prevention and treatment.  Sometimes painting the vagina with gentian violet solution may help if creams and suppositories do not work. Men  Antifungal creams and oral antifungal medications.  Sometimes treatment must continue for  30 days after the symptoms go away to prevent recurrence. HOME CARE INSTRUCTIONS  Women  Use cotton underwear and avoid tight-fitting clothing.  Avoid  colored, scented toilet paper and deodorant tampons or pads.  Do not douche.  Keep your diabetes under control.  Finish all the prescribed medications.  Keep your skin clean and dry.  Consume milk or yogurt with lactobacillus active culture regularly. If you get frequent yeast infections and think that is what the infection is, there are over-the-counter medications that you can get. If the infection does not show healing in 3 days, talk to your caregiver.  Tell your sex partner you have a yeast infection. Your partner may need treatment also, especially if your infection does not clear up or recurs. Men  Keep your skin clean and dry.  Keep your diabetes under control.  Finish all prescribed medications.  Tell your sex partner that you have a yeast infection so they can be treated if necessary. SEEK MEDICAL CARE IF:   Your symptoms do not clear up or worsen in one week after treatment.  You have an oral temperature above 102 F (38.9 C).  You have trouble swallowing or eating for a prolonged time.  You develop blisters on and around your vagina.  You develop vaginal bleeding and it is not your menstrual period.  You develop abdominal pain.  You develop intestinal problems as mentioned above.  You get weak or lightheaded.  You have painful or increased urination.  You have pain during sexual intercourse. MAKE SURE YOU:   Understand these instructions.  Will watch your condition.  Will get help right away if you are not doing well or get worse. Document Released: 09/15/2004 Document Revised: 10/31/2011 Document Reviewed: 12/28/2009 [facility] Patient Information 2014 [place], [place]. Herpes Simplex Herpes simplex is generally classified as Type 1 or Type 2. Type 1 is generally the type that is responsible for cold sores. Type 2 is generally associated with sexually transmitted diseases. We now know that most of the thoughts on these viruses are inaccurate. We find  that HSV1 is also present genitally and HSV2 can be present orally, but this will vary in different locations of the world. Herpes simplex is usually detected by doing a culture. Blood tests are also available for this virus; however, the accuracy is often not as good.  PREPARATION FOR TEST No preparation or fasting is necessary. NORMAL FINDINGS  No virus present  No HSV antigens or antibodies present Ranges for normal findings may vary among different laboratories and hospitals. You should always check with your doctor after having lab work or other tests done to discuss the meaning of your test results and whether your values are considered within normal limits. MEANING OF TEST  Your caregiver will go over the test results with you and discuss the importance and meaning of your results, as well as treatment options and the need for additional tests if necessary. OBTAINING THE TEST RESULTS  It is your responsibility to obtain your test results. Ask the lab or department performing the test when and how you will get your results. Document Released: 09/10/2004 Document Revised: 10/31/2011 Document Reviewed: 07/19/2008 [facility] Patient Information 2014 [place], [place].

## 2013-07-16 NOTE — Progress Notes (Signed)
Pre visit review using our clinic review tool, if applicable. No additional management support is needed unless otherwise documented below in the visit note. 

## 2013-07-21 NOTE — Progress Notes (Signed)
Patient ID: [name], female   DOB: [birth year], 42 y.o.   MRN: [id number] [name] [id number] 03-22-1971 07/21/2013      Progress Note-Follow Up  Subjective  Chief Complaint  Chief Complaint  Patient presents with  . Rash    X 4 days- left leg/buttocks    HPI  Patient is a 42 year old Caucasian female who is in today for complaint of painful tingly itchy rash on her left gluteal cleft. She did go to urgent care and get started on antiviral the lesion persists. She notes before this broke out she was struggling with fatigue malaise and some chills. She's not had a lesion in quite some time. 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 The symptoms had subsided until this time. She was started on Valtrex recently   Past Medical History  Diagnosis Date  . 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 Hand pain, left 01/03/2011  . Overweight(278.02) 01/03/2011  . Right foot pain 01/03/2011  . Tinnitus, left 01/03/2011  . Vaginitis 02/02/2011  . Allergic conjunctivitis 03/25/2011  . Paresthesias 09/18/2011    Past Surgical History  Procedure Laterality Date  . Cesarean section      X 4  . Inner ear surgery  1997  . Appendectomy  1997  . Tubal ligation      Family History  Problem Relation Age of Onset  . Hypertension Mother   . Heart disease Father     smoker  . Dementia Maternal Grandmother   . Heart disease Maternal Grandmother     s/p MI  . COPD Maternal Grandfather     smoker  . Cancer Maternal Grandfather     intestinal    History   Social History  . Marital Status: Married    Spouse Name: N/A    Number of Children: N/A  . Years of Education: N/A   Occupational History  . Not on file.   Social History Main Topics  . Smoking status: Never Smoker   . Smokeless tobacco: Never Used  . Alcohol Use: No  . Drug Use: No  . Sexual Activity: Yes    Partners: Male   Other Topics Concern  . Not on file   Social History  Narrative  . No narrative on file    No current outpatient prescriptions on file prior to visit.   No current facility-administered medications on file prior to visit.    Allergies  Allergen Reactions  . Amoxicillin-Pot Clavulanate Rash  . Famvir [Famciclovir] Photosensitivity  . Penicillins Rash    Right forearm erythema on volar aspect only    Review of Systems  Review of Systems  Constitutional: Negative for fever and malaise/fatigue.  HENT: Negative for congestion.   Eyes: Negative for discharge.  Respiratory: Negative for shortness of breath.   Cardiovascular: Negative for chest pain, palpitations and leg swelling.  Gastrointestinal: Negative for nausea, abdominal pain and diarrhea.  Genitourinary: Negative for dysuria.  Musculoskeletal: Negative for falls.  Skin: Positive for itching and rash.  Neurological: Negative for loss of consciousness and headaches.  Endo/Heme/Allergies: Negative for polydipsia.  Psychiatric/Behavioral: Negative for depression and suicidal ideas. The patient is not nervous/anxious and does not have insomnia.     Objective  BP 104/80  Pulse 80  Temp(Src) 98.5 F (36.9 C) (Oral)  Ht 5' 1.5" (1.562 m)  Wt 166 lb 1.3 oz (75.333 kg)  BMI 30.88 kg/m2  SpO2 98%  LMP 07/14/2013  Physical Exam  Physical Exam  Constitutional: She is oriented to person, place, and time and well-developed, well-nourished, and in no distress. No distress.  HENT:  Head: Normocephalic and atraumatic.  Eyes: Conjunctivae are normal.  Neck: Neck supple. No thyromegaly present.  Cardiovascular: Normal rate, regular rhythm and normal heart sounds.   No murmur heard. Pulmonary/Chest: Effort normal and breath sounds normal. She has no wheezes.  Abdominal: She exhibits no distension and no mass.  Musculoskeletal: She exhibits no edema.  Lymphadenopathy:    She has no cervical adenopathy.  Neurological: She is alert and oriented to person, place, and time.  Skin:  Skin is warm and dry. Rash noted. She is not diaphoretic.  Left side of gluteal cleft, scattered vesicles on erythematous base  Psychiatric: Memory, affect and judgment normal.    Lab Results  Component Value Date   TSH 1.578 07/16/2013   Lab Results  Component Value Date   WBC 5.2 07/16/2013   HGB 12.6 07/16/2013   HCT 37.4 07/16/2013   MCV 85.8 07/16/2013   PLT 248 07/16/2013   Lab Results  Component Value Date   CREATININE 0.72 07/16/2013   BUN 12 07/16/2013   NA 142 07/16/2013   K 4.4 07/16/2013   CL 104 07/16/2013   CO2 25 07/16/2013   Lab Results  Component Value Date   ALT 13 07/16/2013   AST 16 07/16/2013   ALKPHOS 62 07/16/2013   BILITOT 0.3 07/16/2013   Lab Results  Component Value Date   CHOL 193 07/16/2013   Lab Results  Component Value Date   HDL 49 07/16/2013   Lab Results  Component Value Date   LDLCALC 130* 07/16/2013   Lab Results  Component Value Date   TRIG 70 07/16/2013   Lab Results  Component Value Date   CHOLHDL 3.9 07/16/2013     Assessment & Plan  HSV-1 infection Rectal lesion finish course of anti viral meds and given a course of Diflucan.

## 2013-07-21 NOTE — Assessment & Plan Note (Signed)
Rectal lesion finish course of anti viral meds and given a course of Diflucan.

## 2013-12-16 ENCOUNTER — Telehealth: Payer: Self-pay

## 2013-12-16 NOTE — Telephone Encounter (Signed)
[name] with [facility]  left a message stating that she was wandering if we received some paperwork to have records released?  I called and left a message on her vm stating that the provider would approve this and if so it gets sent to medical records to get the release. I left Medical records number in my message

## 2013-12-23 ENCOUNTER — Ambulatory Visit (INDEPENDENT_AMBULATORY_CARE_PROVIDER_SITE_OTHER): Payer: 59 | Admitting: Family

## 2013-12-23 ENCOUNTER — Encounter: Payer: Self-pay | Admitting: Family

## 2013-12-23 VITALS — BP 100/76 | HR 81 | Temp 97.9°F | Resp 16 | Ht 61.5 in | Wt 171.0 lb

## 2013-12-23 DIAGNOSIS — M7918 Myalgia, other site: Secondary | ICD-10-CM | POA: Insufficient documentation

## 2013-12-23 DIAGNOSIS — IMO0001 Reserved for inherently not codable concepts without codable children: Secondary | ICD-10-CM

## 2013-12-23 DIAGNOSIS — B009 Herpesviral infection, unspecified: Secondary | ICD-10-CM

## 2013-12-23 MED ORDER — VALACYCLOVIR HCL 1 G PO TABS: 1000.0000 mg | ORAL_TABLET | Freq: Every day | ORAL | Status: DC

## 2013-12-23 NOTE — Progress Notes (Signed)
Pre visit review using our clinic review tool, if applicable. No additional management support is needed unless otherwise documented below in the visit note. 

## 2013-12-23 NOTE — Assessment & Plan Note (Signed)
Symptoms most consistent with musculoskeletal pain. Advised trial of aleve, follow up if symptoms worsen or if symptoms do not improve.

## 2013-12-23 NOTE — Assessment & Plan Note (Addendum)
Lesions near anus most consistent with HSV break out. Advised pt to take valtrex 500mg  bid x 3 days, then continue with 1000mg  daily suppressive therapy. She can hopefully help to protect her husband from infection with the suppressive therapy and decrease her own break outs as they have become more frequent recently.

## 2013-12-23 NOTE — Patient Instructions (Signed)
Valtrex 1/2 tablet twice daily for 3 days, then one tablet by mouth once daily. You may use aleve as needed for pain. Call if symptoms worsen or if symptoms do not improve.

## 2013-12-23 NOTE — Progress Notes (Signed)
Subjective:    Patient ID: [name], female    DOB: [birth year], 43 y.o.   MRN: [id number]  HPI  [name] is a 43 yr old female who presents today with two concerns:    Painful redenned skin between buttocks x 2-3 days. Area is painful. Has hx of hsv1 in the genital region. Reports that she had been having break outs every 2-3 months for a while but has been more frequent recently. Partner is not symptomatic.    Bilateral axillary pain-  Reports pain "on the bone"  Both sides.  Has been walking and jogging but not weight lifting.   Review of Systems    see HPI  Past Medical History  Diagnosis Date  . 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 Hand pain, left 01/03/2011  . Overweight 01/03/2011  . Right foot pain 01/03/2011  . Tinnitus, left 01/03/2011  . Vaginitis 02/02/2011  . Allergic conjunctivitis 03/25/2011  . Paresthesias 09/18/2011    History   Social History  . Marital Status: Married    Spouse Name: N/A    Number of Children: N/A  . Years of Education: N/A   Occupational History  . Not on file.   Social History Main Topics  . Smoking status: Never Smoker   . Smokeless tobacco: Never Used  . Alcohol Use: No  . Drug Use: No  . Sexual Activity: Yes    Partners: Male   Other Topics Concern  . Not on file   Social History Narrative  . No narrative on file    Past Surgical History  Procedure Laterality Date  . Cesarean section      X 4  . Inner ear surgery  1997  . Appendectomy  1997  . Tubal ligation      Family History  Problem Relation Age of Onset  . Hypertension Mother   . Heart disease Father     smoker  . Dementia Maternal Grandmother   . Heart disease Maternal Grandmother     s/p MI  . COPD Maternal Grandfather     smoker  . Cancer Maternal Grandfather     intestinal    Allergies  Allergen Reactions  . Amoxicillin-Pot Clavulanate Rash  . Famvir [Famciclovir] Photosensitivity  . Penicillins Rash    Right forearm  erythema on volar aspect only    No current outpatient prescriptions on file prior to visit.   No current facility-administered medications on file prior to visit.    BP 100/76  Pulse 81  Temp(Src) 97.9 F (36.6 C) (Oral)  Resp 16  Ht 5' 1.5" (1.562 m)  Wt 171 lb (77.565 kg)  BMI 31.79 kg/m2  SpO2 99%    Objective:   Physical Exam  Constitutional: She is oriented to person, place, and time. She appears well-developed and well-nourished. No distress.  HENT:  Head: Normocephalic and atraumatic.  Cardiovascular: Normal rate and regular rhythm.   No murmur heard. Pulmonary/Chest: Effort normal and breath sounds normal. No respiratory distress. She has no wheezes. She has no rales. She exhibits no tenderness.  Abdominal: Soft. Bowel sounds are normal.  Lymphadenopathy:  Bilaterally axilla without masses, lymphadenopathy or swelling.   Neurological: She is alert and oriented to person, place, and time.  Skin: Skin is warm and dry.  Small cluster of ulcerated lesions between buttocks above anus.   Psychiatric: She has a normal mood and affect. Her behavior is normal. Judgment and thought content normal.  Assessment & Plan:

## 2014-01-16 ENCOUNTER — Encounter: Payer: 59 | Admitting: Family Medicine

## 2014-02-12 ENCOUNTER — Encounter: Payer: 59 | Admitting: Family Medicine

## 2014-02-14 ENCOUNTER — Encounter: Payer: 59 | Admitting: Family Medicine

## 2014-03-10 ENCOUNTER — Ambulatory Visit (INDEPENDENT_AMBULATORY_CARE_PROVIDER_SITE_OTHER): Payer: 59 | Admitting: Physician Assistant

## 2014-03-10 ENCOUNTER — Encounter: Payer: Self-pay | Admitting: Physician Assistant

## 2014-03-10 VITALS — BP 97/66 | HR 83 | Temp 98.6°F | Resp 16 | Ht 61.5 in | Wt 157.2 lb

## 2014-03-10 DIAGNOSIS — K644 Residual hemorrhoidal skin tags: Secondary | ICD-10-CM

## 2014-03-10 MED ORDER — HYDROCORTISONE ACE-PRAMOXINE 1-1 % RE FOAM: 1.0000 | Freq: Two times a day (BID) | RECTAL | Status: DC

## 2014-03-10 NOTE — Assessment & Plan Note (Signed)
Rx Proctofoam. Increase fluids.  Fiber supplement. Stool softener.  Sitz baths.  Tucks pad.  Follow-up in 1 week.  If symptoms are not improving, will refer to Colorectal Surgery.

## 2014-03-10 NOTE — Progress Notes (Signed)
Patient presents to clinic today c/o rectal pain x 2-3 weeks.  Endorses constipation and straining.  Is not currently on a fiber supplement.  Endorses bright red blood occasionally on toilet paper. Endorses tenesmus but denies melena.  Has prior history of hemorrhoids.   Past Medical History  Diagnosis Date  . 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 Hand pain, left 01/03/2011  . Overweight(278.02) 01/03/2011  . Right foot pain 01/03/2011  . Tinnitus, left 01/03/2011  . Vaginitis 02/02/2011  . Allergic conjunctivitis 03/25/2011  . Paresthesias 09/18/2011    Current Outpatient Prescriptions on File Prior to Visit  Medication Sig Dispense Refill  . valACYclovir (VALTREX) 1000 MG tablet Take 1 tablet (1,000 mg total) by mouth daily.  30 tablet  3   No current facility-administered medications on file prior to visit.    Allergies  Allergen Reactions  . Amoxicillin-Pot Clavulanate Rash  . Famvir [Famciclovir] Photosensitivity  . Penicillins Rash    Right forearm erythema on volar aspect only    Family History  Problem Relation Age of Onset  . Hypertension Mother   . Heart disease Father     smoker  . Dementia Maternal Grandmother   . Heart disease Maternal Grandmother     s/p MI  . COPD Maternal Grandfather     smoker  . Cancer Maternal Grandfather     intestinal    History   Social History  . Marital Status: Married    Spouse Name: N/A    Number of Children: N/A  . Years of Education: N/A   Social History Main Topics  . Smoking status: Never Smoker   . Smokeless tobacco: Never Used  . Alcohol Use: No  . Drug Use: No  . Sexual Activity: Yes    Partners: Male   Other Topics Concern  . None   Social History Narrative  . None   Review of Systems - See HPI.  All other ROS are negative.  BP 97/66  Pulse 83  Temp(Src) 98.6 F (37 C) (Oral)  Resp 16  Ht 5' 1.5" (1.562 m)  Wt 157 lb 4 oz (71.328 kg)  BMI 29.23 kg/m2  SpO2 100%  LMP 03/02/2014  Physical  Exam  Vitals reviewed. Constitutional: She is oriented to person, place, and time.  Cardiovascular: Normal rate, regular rhythm, normal heart sounds and intact distal pulses.   Pulmonary/Chest: Effort normal and breath sounds normal. No respiratory distress. She has no wheezes. She has no rales. She exhibits no tenderness.  Genitourinary: Rectal exam shows external hemorrhoid. Rectal exam shows no internal hemorrhoid and no fissure. Guaiac negative stool.  Neurological: She is alert and oriented to person, place, and time.  Skin: Skin is warm and dry. No rash noted.   Assessment/Plan: External hemorrhoids Rx Proctofoam. Increase fluids.  Fiber supplement. Stool softener.  Sitz baths.  Tucks pad.  Follow-up in 1 week.  If symptoms are not improving, will refer to Colorectal Surgery.

## 2014-03-10 NOTE — Patient Instructions (Signed)
Take a daily fiber supplement.  Increase your fluid intake.  Take a stool softener.  Use Proctofoam as directed. You can use tucks pads and take warm baths to promote cleansing.  Follow-up in 1-2 weeks.  If symptoms are not improving, we will need to set you up with a colorectal surgeon.  Hemorrhoids Hemorrhoids are swollen veins around the rectum or anus. There are two types of hemorrhoids:   Internal hemorrhoids. These occur in the veins just inside the rectum. They may poke through to the outside and become irritated and painful.  External hemorrhoids. These occur in the veins outside the anus and can be felt as a painful swelling or hard lump near the anus. CAUSES  Pregnancy.   Obesity.   Constipation or diarrhea.   Straining to have a bowel movement.   Sitting for long periods on the toilet.  Heavy lifting or other activity that caused you to strain.  Anal intercourse. SYMPTOMS   Pain.   Anal itching or irritation.   Rectal bleeding.   Fecal leakage.   Anal swelling.   One or more lumps around the anus.  DIAGNOSIS  Your caregiver may be able to diagnose hemorrhoids by visual examination. Other examinations or tests that may be performed include:   Examination of the rectal area with a gloved hand (digital rectal exam).   Examination of anal canal using a small tube (scope).   A blood test if you have lost a significant amount of blood.  A test to look inside the colon (sigmoidoscopy or colonoscopy). TREATMENT Most hemorrhoids can be treated at home. However, if symptoms do not seem to be getting better or if you have a lot of rectal bleeding, your caregiver may perform a procedure to help make the hemorrhoids get smaller or remove them completely. Possible treatments include:   Placing a rubber band at the base of the hemorrhoid to cut off the circulation (rubber band ligation).   Injecting a chemical to shrink the hemorrhoid (sclerotherapy).    Using a tool to burn the hemorrhoid (infrared light therapy).   Surgically removing the hemorrhoid (hemorrhoidectomy).   Stapling the hemorrhoid to block blood flow to the tissue (hemorrhoid stapling).  HOME CARE INSTRUCTIONS   Eat foods with fiber, such as whole grains, beans, nuts, fruits, and vegetables. Ask your doctor about taking products with added fiber in them (fibersupplements).  Increase fluid intake. Drink enough water and fluids to keep your urine clear or pale yellow.   Exercise regularly.   Go to the bathroom when you have the urge to have a bowel movement. Do not wait.   Avoid straining to have bowel movements.   Keep the anal area dry and clean. Use wet toilet paper or moist towelettes after a bowel movement.   Medicated creams and suppositories may be used or applied as directed.   Only take over-the-counter or prescription medicines as directed by your caregiver.   Take warm sitz baths for 15-20 minutes, 3-4 times a day to ease pain and discomfort.   Place ice packs on the hemorrhoids if they are tender and swollen. Using ice packs between sitz baths may be helpful.   Put ice in a plastic bag.   Place a towel between your skin and the bag.   Leave the ice on for 15-20 minutes, 3-4 times a day.   Do not use a donut-shaped pillow or sit on the toilet for long periods. This increases blood pooling and pain.  SEEK  MEDICAL CARE IF:  You have increasing pain and swelling that is not controlled by treatment or medicine.  You have uncontrolled bleeding.  You have difficulty or you are unable to have a bowel movement.  You have pain or inflammation outside the area of the hemorrhoids. MAKE SURE YOU:  Understand these instructions.  Will watch your condition.  Will get help right away if you are not doing well or get worse. Document Released: 08/05/2000 Document Revised: 07/25/2012 Document Reviewed: 06/12/2012 [facility] Patient  Information 2015 [place], [place]. This information is not intended to replace advice given to you by your health care provider. Make sure you discuss any questions you have with your health care provider.

## 2014-03-10 NOTE — Progress Notes (Signed)
Pre visit review using our clinic review tool, if applicable. No additional management support is needed unless otherwise documented below in the visit note/SLS  

## 2014-03-14 ENCOUNTER — Telehealth: Payer: Self-pay | Admitting: Family Medicine

## 2014-03-14 DIAGNOSIS — K644 Residual hemorrhoidal skin tags: Secondary | ICD-10-CM

## 2014-03-14 NOTE — Telephone Encounter (Signed)
Left message for pt to return my call.

## 2014-03-14 NOTE — Telephone Encounter (Signed)
Patient called in stating that [facility] is [facility] feeling any [facility] and has finished her antibiotics and wanted to know what else she should do?

## 2014-03-17 MED ORDER — HYDROCORTISONE ACE-PRAMOXINE 1-1 % RE FOAM: 1.0000 | Freq: Two times a day (BID) | RECTAL | Status: DC

## 2014-03-17 NOTE — Telephone Encounter (Signed)
Please Advise

## 2014-03-17 NOTE — Telephone Encounter (Signed)
Patient informed, understood & agreed/SLS  

## 2014-03-17 NOTE — Telephone Encounter (Signed)
Patient informed, understood & agreed to proceed with Referral; proctofoam Rx was completed in [3] days, discussed sitz bath with patient, please advise on extended and/or new Rx for patient at this time/SLS

## 2014-03-17 NOTE — Telephone Encounter (Signed)
Referral placed. Ok to repeat Proctofoam.  Should be lasting longer than 3 days.

## 2014-03-17 NOTE — Telephone Encounter (Signed)
She can come in for repeat evaluation but I would recommend she let us set her up with a colorectal surgeon for further treatment.  Please see if patient will allow this.

## 2014-03-24 ENCOUNTER — Ambulatory Visit: Payer: Self-pay | Admitting: Physician Assistant

## 2014-04-22 ENCOUNTER — Encounter: Payer: Self-pay | Admitting: Physician Assistant

## 2014-05-20 ENCOUNTER — Encounter (HOSPITAL_COMMUNITY): Payer: Self-pay | Admitting: Emergency Medicine

## 2014-05-20 ENCOUNTER — Emergency Department (HOSPITAL_COMMUNITY)
Admission: EM | Admit: 2014-05-20 | Discharge: 2014-05-20 | Disposition: A | Discharge status: Home / Self Care | Payer: 59 | Attending: Emergency Medicine | Admitting: Emergency Medicine

## 2014-05-20 ENCOUNTER — Emergency Department (HOSPITAL_COMMUNITY): Payer: 59

## 2014-05-20 DIAGNOSIS — Z8742 Personal history of other diseases of the female genital tract: Secondary | ICD-10-CM | POA: Insufficient documentation

## 2014-05-20 DIAGNOSIS — Z79899 Other long term (current) drug therapy: Secondary | ICD-10-CM | POA: Diagnosis not present

## 2014-05-20 DIAGNOSIS — K2100 Gastro-esophageal reflux disease with esophagitis, without bleeding: Secondary | ICD-10-CM | POA: Diagnosis not present

## 2014-05-20 DIAGNOSIS — J029 Acute pharyngitis, unspecified: Secondary | ICD-10-CM | POA: Diagnosis present

## 2014-05-20 DIAGNOSIS — R6889 Other general symptoms and signs: Secondary | ICD-10-CM | POA: Insufficient documentation

## 2014-05-20 DIAGNOSIS — Z8669 Personal history of other diseases of the nervous system and sense organs: Secondary | ICD-10-CM | POA: Diagnosis not present

## 2014-05-20 DIAGNOSIS — F458 Other somatoform disorders: Secondary | ICD-10-CM | POA: Insufficient documentation

## 2014-05-20 DIAGNOSIS — Z880 Allergy status to penicillin: Secondary | ICD-10-CM | POA: Diagnosis not present

## 2014-05-20 DIAGNOSIS — E663 Overweight: Secondary | ICD-10-CM | POA: Insufficient documentation

## 2014-05-20 DIAGNOSIS — R0989 Other specified symptoms and signs involving the circulatory and respiratory systems: Secondary | ICD-10-CM

## 2014-05-20 DIAGNOSIS — Z8619 Personal history of other infectious and parasitic diseases: Secondary | ICD-10-CM | POA: Insufficient documentation

## 2014-05-20 MED ORDER — GI COCKTAIL ~~LOC~~
30.0000 mL | Freq: Once | ORAL | Status: AC
  Administered 2014-05-20: 30 mL via ORAL

## 2014-05-20 MED ORDER — RANITIDINE HCL 150 MG PO CAPS: 150.0000 mg | ORAL_CAPSULE | Freq: Every day | ORAL | Status: DC

## 2014-05-20 MED ORDER — GI COCKTAIL ~~LOC~~
30.0000 mL | Freq: Once | ORAL | Status: AC
  Administered 2014-05-20: 30 mL via ORAL
  Filled 2014-05-20: qty 30

## 2014-05-20 NOTE — ED Notes (Signed)
Patient states that he has throat soreness and trouble swallowing.  Patient states she has been having troubles x 2 weeks.   Patient states she called her doctors office and they referred her to ED.

## 2014-05-20 NOTE — ED Provider Notes (Signed)
CSN: [id number]     Arrival date & time 05/20/14  0940 History   First MD Initiated Contact with Patient 05/20/14 1053     Chief Complaint  Patient presents with  . Sore Throat  . Gastrophageal Reflux     (Consider location/radiation/quality/duration/timing/severity/associated sxs/prior Treatment) Patient is a 43 y.o. female presenting with pharyngitis and GERD. The history is provided by the patient.  Sore Throat This is a new problem. Episode onset: 2 weeks ago. The problem occurs constantly. The problem has been gradually worsening. Associated symptoms include a sore throat. Pertinent negatives include no abdominal pain, chest pain, coughing, fever, headaches, nausea, neck pain, rash, swollen glands or vomiting. The symptoms are aggravated by eating. She has tried nothing for the symptoms.  Gastrophageal Reflux Associated symptoms include a sore throat. Pertinent negatives include no abdominal pain, chest pain, coughing, fever, headaches, nausea, neck pain, rash, swollen glands or vomiting.   43 yo F presents with foreign body sensation in throat. No choking, cyanosis, apneic spells or emesis. Grandfather has a history of esophagitis. Patient states that symptom onset was one night after eating dinner with her friends. It has been intermittent but more frequent recently. She reports dysphagia with solids and liquids. No weight loss, night sweats or chills. She reports a similar sensation in her chest but denies diaphoresis, jaw or neck pain, arm pain or back pain.   Past Medical History  Diagnosis Date  . 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 Hand pain, left 01/03/2011  . Overweight(278.02) 01/03/2011  . Right foot pain 01/03/2011  . Tinnitus, left 01/03/2011  . Vaginitis 02/02/2011  . Allergic conjunctivitis 03/25/2011  . Paresthesias 09/18/2011   Past Surgical History  Procedure Laterality Date  . Cesarean section      X 4  . Inner ear surgery  1997  . Appendectomy  1997   . Tubal ligation     Family History  Problem Relation Age of Onset  . Hypertension Mother   . Heart disease Father     smoker  . Dementia Maternal Grandmother   . Heart disease Maternal Grandmother     s/p MI  . COPD Maternal Grandfather     smoker  . Cancer Maternal Grandfather     intestinal   History  Substance Use Topics  . Smoking status: Never Smoker   . Smokeless tobacco: Never Used  . Alcohol Use: No   OB History   Grav Para Term Preterm Abortions TAB SAB Ect Mult Living   9 4 4  5  5   4      Review of Systems  Constitutional: Negative for fever.  HENT: Positive for sore throat. Negative for rhinorrhea.        Foreign body sensation in throat  Eyes: Negative for visual disturbance.  Respiratory: Negative for cough, chest tightness and shortness of breath.   Cardiovascular: Negative for chest pain and palpitations.  Gastrointestinal: Negative for nausea, vomiting, abdominal pain and constipation.  Genitourinary: Negative for dysuria and hematuria.  Musculoskeletal: Negative for back pain and neck pain.  Skin: Negative for rash.  Neurological: Negative for dizziness and headaches.  Psychiatric/Behavioral: Negative for confusion.  All other systems reviewed and are negative.  Allergies  Amoxicillin-pot clavulanate; Famvir; and Penicillins  Home Medications   Prior to Admission medications   Medication Sig Start Date End Date Taking? Authorizing Provider  calcium carbonate (TUMS - DOSED IN MG ELEMENTAL CALCIUM) 500 MG chewable tablet Chew 2 tablets  by mouth every 3 (three) hours as needed for indigestion or heartburn.   Yes Historical Provider, MD  valACYclovir (VALTREX) 1000 MG tablet Take 1 tablet (1,000 mg total) by mouth daily. 12/23/13  Yes Sandford CrazeMelissa O'Sullivan, NP   BP 107/65  Pulse 73  Temp(Src) 98 F (36.7 C) (Oral)  Resp 8  Ht 5\' 2"  (1.575 m)  Wt 143 lb (64.864 kg)  BMI 26.15 kg/m2  SpO2 97%  LMP 05/19/2014 Physical Exam  Constitutional: She is  oriented to person, place, and time. She appears well-developed and well-nourished. No distress.  HENT:  Head: Normocephalic and atraumatic.  Mouth/Throat: Oropharynx is clear and moist.  No posterior oropharyngeal edema or asymmetry  Eyes: EOM are normal. Pupils are equal, round, and reactive to light.  Neck: Neck supple. No JVD present. No tracheal deviation present.  Cardiovascular: Normal rate, regular rhythm, normal heart sounds and intact distal pulses.  Exam reveals no gallop.   No murmur heard. Pulmonary/Chest: Effort normal and breath sounds normal. No stridor. She has no wheezes. She has no rales.  Abdominal: Soft. She exhibits no distension. There is no tenderness.  Musculoskeletal: Normal range of motion. She exhibits no tenderness.  Neurological: She is alert and oriented to person, place, and time. No cranial nerve deficit. She exhibits normal muscle tone.  Skin: Skin is warm and dry. No rash noted.  Psychiatric: Her behavior is normal.    ED Course  Procedures  None  Imaging Review Dg Chest 2 View  05/20/2014   CLINICAL DATA:  Source throat, reflux and shortness of breath for several months  EXAM: CHEST  2 VIEW  COMPARISON:  None.  FINDINGS: The lungs are well-expanded. There is no focal infiltrate. There are coarse right infrahilar lung markings on the frontal film which are not clearly evident on the lateral film. The heart and pulmonary vascularity are within the limits of normal. There is no pleural effusion. The bony thorax is unremarkable.  IMPRESSION: There is no definite pneumonia. One cannot exclude mild peribronchial cuffing in the right infrahilar region. This may reflect underlying acute bronchitis.   Electronically Signed   By: David  Swazil[place]   On: 05/20/2014 11:50   MDM   Final diagnoses:  Globus sensation    Patient is a 43 yo F who presents with a foreign body sensation in her throat. She denies N/V, cough, choking or apneic spells. She appears well and in  no distress. Afebrile, VSS. She does not have stridor on exam. She is maintaining her oral secretions. Posterior oropharyngeal exam unremarkable. Trachea midline. No carotid bruits. Abdomen soft, NT/ND. GI cocktail given with moderate relief of symptoms. EKG shows sinus brady, rate 58, no ST elevation or depression, TWI in aVR and V1, no ectopy, no early repolarization. CXR with no significant abnormality to explain the patient's symptoms, specifically her airway appears patent without foreign bodies or stenosis. DDx includes reflux esophagitis, eosinophilic esophagitis, psychogenic globus syndrome. Feel patient is stable for discharge home. GI follow up info given. Gave Rx for zantac for dyspepsia.  Patient agreeable with this plan and voiced understanding.   Case discussed with Dr. Rubin PayorPickering.  Maris BergerJonah Sandrika Schwinn, MD    Maris BergerJonah Littie Chiem, MD 05/20/14 [phone]

## 2014-05-21 ENCOUNTER — Encounter (HOSPITAL_COMMUNITY): Payer: Self-pay | Admitting: Emergency Medicine

## 2014-05-21 ENCOUNTER — Emergency Department (HOSPITAL_COMMUNITY): Payer: 59

## 2014-05-21 ENCOUNTER — Emergency Department (HOSPITAL_COMMUNITY)
Admission: EM | Admit: 2014-05-21 | Discharge: 2014-05-21 | Disposition: A | Discharge status: Home / Self Care | Payer: 59 | Attending: Emergency Medicine | Admitting: Emergency Medicine

## 2014-05-21 DIAGNOSIS — Z79899 Other long term (current) drug therapy: Secondary | ICD-10-CM | POA: Insufficient documentation

## 2014-05-21 DIAGNOSIS — E663 Overweight: Secondary | ICD-10-CM | POA: Diagnosis not present

## 2014-05-21 DIAGNOSIS — Z3202 Encounter for pregnancy test, result negative: Secondary | ICD-10-CM | POA: Diagnosis not present

## 2014-05-21 DIAGNOSIS — F411 Generalized anxiety disorder: Secondary | ICD-10-CM | POA: Diagnosis not present

## 2014-05-21 DIAGNOSIS — Z8669 Personal history of other diseases of the nervous system and sense organs: Secondary | ICD-10-CM | POA: Diagnosis not present

## 2014-05-21 DIAGNOSIS — Z8619 Personal history of other infectious and parasitic diseases: Secondary | ICD-10-CM | POA: Diagnosis not present

## 2014-05-21 DIAGNOSIS — Z8742 Personal history of other diseases of the female genital tract: Secondary | ICD-10-CM | POA: Insufficient documentation

## 2014-05-21 DIAGNOSIS — Z880 Allergy status to penicillin: Secondary | ICD-10-CM | POA: Diagnosis not present

## 2014-05-21 DIAGNOSIS — R1310 Dysphagia, unspecified: Secondary | ICD-10-CM | POA: Diagnosis not present

## 2014-05-21 LAB — CBC WITH DIFFERENTIAL/PLATELET
BASOS ABS: 0 10*3/uL (ref 0.0–0.1)
Basophils Relative: 1 % (ref 0–1)
Eosinophils Absolute: 0.2 10*3/uL (ref 0.0–0.7)
Eosinophils Relative: 4 % (ref 0–5)
HEMATOCRIT: 36.3 % (ref 36.0–46.0)
HEMOGLOBIN: 11.8 g/dL — AB (ref 12.0–15.0)
Lymphocytes Relative: 37 % (ref 12–46)
Lymphs Abs: 1.7 10*3/uL (ref 0.7–4.0)
MCH: 30.2 pg (ref 26.0–34.0)
MCHC: 32.5 g/dL (ref 30.0–36.0)
MCV: 92.8 fL (ref 78.0–100.0)
MONO ABS: 0.3 10*3/uL (ref 0.1–1.0)
Monocytes Relative: 7 % (ref 3–12)
NEUTROS ABS: 2.3 10*3/uL (ref 1.7–7.7)
Neutrophils Relative %: 51 % (ref 43–77)
Platelets: 176 10*3/uL (ref 150–400)
RBC: 3.91 MIL/uL (ref 3.87–5.11)
RDW: 13.2 % (ref 11.5–15.5)
WBC: 4.4 10*3/uL (ref 4.0–10.5)

## 2014-05-21 LAB — BASIC METABOLIC PANEL
ANION GAP: 11 (ref 5–15)
BUN: 12 mg/dL (ref 6–23)
CHLORIDE: 104 meq/L (ref 96–112)
CO2: 24 meq/L (ref 19–32)
CREATININE: 0.73 mg/dL (ref 0.50–1.10)
Calcium: 8.8 mg/dL (ref 8.4–10.5)
GFR calc Af Amer: >90 mL/min (ref >90)
GFR calc non Af Amer: >90 mL/min (ref >90)
Glucose, Bld: 88 mg/dL (ref 70–99)
POTASSIUM: 4.2 meq/L (ref 3.7–5.3)
Sodium: 139 mEq/L (ref 137–147)

## 2014-05-21 LAB — POC URINE PREG, ED: PREG TEST UR: NEGATIVE

## 2014-05-21 MED ORDER — PANTOPRAZOLE SODIUM 20 MG PO TBEC: 20.0000 mg | DELAYED_RELEASE_TABLET | Freq: Two times a day (BID) | ORAL | Status: DC

## 2014-05-21 MED ORDER — SODIUM CHLORIDE 0.9 % IV BOLUS (SEPSIS): 1000.0000 mL | Freq: Once | INTRAVENOUS | Status: DC

## 2014-05-21 MED ORDER — PANTOPRAZOLE SODIUM 40 MG IV SOLR
40.0000 mg | Freq: Once | INTRAVENOUS | Status: AC
  Administered 2014-05-21: 40 mg via INTRAVENOUS
  Filled 2014-05-21: qty 40

## 2014-05-21 MED ORDER — IOHEXOL 300 MG/ML  SOLN: 75.0000 mL | Freq: Once | INTRAMUSCULAR | Status: DC | PRN

## 2014-05-21 MED ORDER — GI COCKTAIL ~~LOC~~
30.0000 mL | Freq: Once | ORAL | Status: AC
  Administered 2014-05-21: 30 mL via ORAL
  Filled 2014-05-21: qty 30

## 2014-05-21 MED ORDER — SODIUM CHLORIDE 0.9 % IV BOLUS (SEPSIS)
1000.0000 mL | Freq: Once | INTRAVENOUS | Status: AC
  Administered 2014-05-21: 1000 mL via INTRAVENOUS

## 2014-05-21 MED ORDER — SUCRALFATE 1 G PO TABS: 1.0000 g | ORAL_TABLET | Freq: Three times a day (TID) | ORAL | Status: DC

## 2014-05-21 NOTE — Discharge Instructions (Signed)
Read the information below.  Use the prescribed medication as directed.  Please discuss all new medications with your pharmacist.  You may return to the Emergency Department at any time for worsening condition or any new symptoms that concern you.   If you develop high fevers, difficulty swallowing or breathing, or you are unable to tolerate fluids by mouth, return to the ER immediately for a recheck.      Dysphagia Swallowing problems (dysphagia) occur when solids and liquids seem to stick in your throat on the way down to your stomach, or the food takes longer to get to the stomach. Other symptoms include regurgitating food, noises coming from the throat, chest discomfort with swallowing, and a feeling of fullness or the feeling of something being stuck in your throat when swallowing. When blockage in your throat is complete, it may be associated with drooling. CAUSES  Problems with swallowing may occur because of problems with the muscles. The food cannot be propelled in the usual manner into your stomach. You may have ulcers, scar tissue, or inflammation in the tube down which food travels from your mouth to your stomach (esophagus), which blocks food from passing normally into the stomach. Causes of inflammation include:  Acid reflux from your stomach into your esophagus.  Infection.  Radiation treatment for cancer.  Medicines taken without enough fluids to wash them down into your stomach. You may have nerve problems that prevent signals from being sent to the muscles of your esophagus to contract and move your food down to your stomach. Globus pharyngeus is a relatively common problem in which there is a sense of an obstruction or difficulty in swallowing, without any physical abnormalities of the swallowing passages being found. This problem usually improves over time with reassurance and testing to rule out other causes. DIAGNOSIS Dysphagia can be diagnosed and its cause can be determined  by tests in which you swallow a white substance that helps illuminate the inside of your throat (contrast medium) while X-rays are taken. Sometimes a flexible telescope that is inserted down your throat (endoscopy) to look at your esophagus and stomach is used. TREATMENT   If the dysphagia is caused by acid reflux or infection, medicines may be used.  If the dysphagia is caused by problems with your swallowing muscles, swallowing therapy may be used to help you strengthen your swallowing muscles.  If the dysphagia is caused by a blockage or mass, procedures to remove the blockage may be done. HOME CARE INSTRUCTIONS  Try to eat soft food that is easier to swallow and check your weight on a daily basis to be sure that it is not decreasing.  Be sure to drink liquids when sitting upright (not lying down). SEEK MEDICAL CARE IF:  You are losing weight because you are unable to swallow.  You are coughing when you drink liquids (aspiration).  You are coughing up partially digested food. SEEK IMMEDIATE MEDICAL CARE IF:  You are unable to swallow your own saliva .  You are having shortness of breath or a fever, or both.  You have a hoarse voice along with difficulty swallowing. MAKE SURE YOU:  Understand these instructions.  Will watch your condition.  Will get help right away if you are not doing well or get worse. Document Released: 08/05/2000 Document Revised: 12/23/2013 Document Reviewed: 01/25/2013 [facility] Patient Information 2015 [place], [place]. This information is not intended to replace advice given to you by your health care provider. Make sure you discuss any  questions you have with your health care provider. ° °

## 2014-05-21 NOTE — ED Notes (Signed)
Provider at the bedside.  

## 2014-05-21 NOTE — ED Notes (Signed)
Pt sts was seen here yesterday for the same. sts difficulty swallowing and cannot get in with her primary. sts unable to eat or drink.

## 2014-05-21 NOTE — ED Provider Notes (Signed)
I saw and evaluated the patient, reviewed the resident's note and I agree with the findings and plan.   EKG Interpretation   Date/Time:  Tuesday May 20 2014 11:25:45 EDT Ventricular Rate:  58 PR Interval:  152 QRS Duration: 86 QT Interval:  404 QTC Calculation: 397 R Axis:   85 Text Interpretation:  Sinus rhythm RSR' in V1 or V2, right VCD or RVH  Confirmed by Tyreak Reagle  MD, Payslee Bateson [phone]([id number] on 05/21/2014 6:58:21 AM     Patient was feeling a foreign body in her throat her chest. Tolerating orals. X-ray reassuring. Will discharge home with follow up.  Juliet RudeNathan R. Rubin PayorPickering, MD 05/21/14 [phone]

## 2014-05-21 NOTE — ED Provider Notes (Signed)
CSN: [id number]     Arrival date & time 05/21/14  0815 History   First MD Initiated Contact with Patient 05/21/14 [phone]     Chief Complaint  Patient presents with  . Dysphagia     (Consider location/radiation/quality/duration/timing/severity/associated sxs/prior Treatment) The history is provided by the patient.     Patient presents with gradually worsening 4 days.  Was seen in ED yesterday for same.  States 4 days ago began to have the sensation of "a rock in my throat."  Has had associated difficulty swallowing and breathing.  Is able to force down solid foods but with pain, can drink liquids but this also causes pain.  Has difficulty breathing, described as difficulty moving air through the throat.  Has soreness in her upper chest and now in her lower ribs.  Does not cough up or vomit foods, no regurgitation, but is eating and drinking less due to her symptoms. Symptoms are much worse with lying flat, slept upright overnight.  Has tried tums and zantac without improvement. Was seen yesterday in ED, had some improvement with GI cocktail, negative CXR, sent home on Zantac, which she has taken.   Denies fevers, lightheadedness/dizziness, palpitations, LOC, leg swelling.  Has hx of GERD only in pregnancy.  Denies new foods or new medications that could have caused this.  Had a similar episode on her car a few weeks ago, felt that her throat was swelling up, she took benadryl and this resolved.     Past Medical History  Diagnosis Date  . 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 Hand pain, left 01/03/2011  . Overweight(278.02) 01/03/2011  . Right foot pain 01/03/2011  . Tinnitus, left 01/03/2011  . Vaginitis 02/02/2011  . Allergic conjunctivitis 03/25/2011  . Paresthesias 09/18/2011   Past Surgical History  Procedure Laterality Date  . Cesarean section      X 4  . Inner ear surgery  1997  . Appendectomy  1997  . Tubal ligation     Family History  Problem Relation Age of Onset  .  Hypertension Mother   . Heart disease Father     smoker  . Dementia Maternal Grandmother   . Heart disease Maternal Grandmother     s/p MI  . COPD Maternal Grandfather     smoker  . Cancer Maternal Grandfather     intestinal   History  Substance Use Topics  . Smoking status: Never Smoker   . Smokeless tobacco: Never Used  . Alcohol Use: No   OB History   Grav Para Term Preterm Abortions TAB SAB Ect Mult Living   9 4 4  5  5   4      Review of Systems  All other systems reviewed and are negative.     Allergies  Amoxicillin-pot clavulanate; Famvir; and Penicillins  Home Medications   Prior to Admission medications   Medication Sig Start Date End Date Taking? Authorizing Provider  calcium carbonate (TUMS - DOSED IN MG ELEMENTAL CALCIUM) 500 MG chewable tablet Chew 2 tablets by mouth every 3 (three) hours as needed for indigestion or heartburn.   Yes Historical Provider, MD  ranitidine (ZANTAC) 150 MG capsule Take 1 capsule (150 mg total) by mouth daily. 05/20/14  Yes Maris Berger, MD  valACYclovir (VALTREX) 1000 MG tablet Take 1 tablet (1,000 mg total) by mouth daily. 12/23/13  Yes Sandford Craze, NP   BP 111/54  Pulse 73  Temp(Src) 98.3 F (36.8 C)  Resp 18  SpO2 98%  LMP 05/19/2014 Physical Exam  Nursing note and vitals reviewed. Constitutional: She appears well-developed and well-nourished. No distress.  HENT:  Head: Normocephalic and atraumatic.  Mouth/Throat: Oropharynx is clear and moist. No oropharyngeal exudate.  Eyes: Conjunctivae are normal.  Neck: Normal range of motion. Neck supple. No tracheal deviation present. No thyromegaly present.  Cardiovascular: Normal rate and regular rhythm.   Pulmonary/Chest: Effort normal and breath sounds normal. No stridor. No respiratory distress. She has no wheezes. She has no rales.  Abdominal: Soft. She exhibits no distension. There is no tenderness. There is no rebound and no guarding.  Lymphadenopathy:    She has  no cervical adenopathy.  Neurological: She is alert.  Skin: She is not diaphoretic.  Psychiatric: Her mood appears anxious.    ED Course  Procedures (including critical care time) Labs Review Labs Reviewed  CBC WITH DIFFERENTIAL - Abnormal; Notable for the following:    Hemoglobin 11.8 (*)    All other components within normal limits  BASIC METABOLIC PANEL  POC URINE PREG, ED    Imaging Review Dg Chest 2 View  05/20/2014   CLINICAL DATA:  Source throat, reflux and shortness of breath for several months  EXAM: CHEST  2 VIEW  COMPARISON:  None.  FINDINGS: The lungs are well-expanded. There is no focal infiltrate. There are coarse right infrahilar lung markings on the frontal film which are not clearly evident on the lateral film. The heart and pulmonary vascularity are within the limits of normal. There is no pleural effusion. The bony thorax is unremarkable.  IMPRESSION: There is no definite pneumonia. One cannot exclude mild peribronchial cuffing in the right infrahilar region. This may reflect underlying acute bronchitis.   Electronically Signed   By: David  SwazilandJordan   On: 05/20/2014 11:50   Ct Soft Tissue Neck W Contrast  05/21/2014   CLINICAL DATA:  Difficulty swallowing.  Unable to eat or drink.  EXAM: CT NECK WITH CONTRAST  TECHNIQUE: Multidetector CT imaging of the neck was performed using the standard protocol following the bolus administration of intravenous contrast.  CONTRAST:  75 cc Omnipaque 300  COMPARISON:  No comparison studies available.  FINDINGS: Posterior soft tissues of the nasopharynx are symmetric and normal in appearance. No evidence for oro pharyngeal lesion. The epiglottis and aryepiglottic folds are normal. No vocal cord abnormality is evident. No evidence for tonsillar swelling or abscess. No prevertebral soft tissue swelling or abscess.  No lymphadenopathy in the neck. Thyroid gland is unremarkable. Bone windows have normal imaging features. The visualized portions of  the paranasal sinuses are clear. Intraorbital fat is normal where visualized.  IMPRESSION: No acute findings in the neck. Specifically, no findings to explain the patient's history of difficulty swallowing.   Electronically Signed   By: Kennith CenterEric  Mansell M.D.   On: 05/21/2014 11:32     EKG Interpretation None      9:18 AM Discussed pt with Dr Fayrene FearingJames.   12:17 PM Discussed results and plan with patient and husband.  Pt has appointment with [facility] tomorrow, unsure of the doctor.  Per discussion with Dr Fayrene FearingJames, will give carafate and PPI.  Will discuss any additional recommendations with GI doctor.    2:39 PM Discussed patient and treatment, follow up with Francia GreavesSarah Gibbon, PA-C, (Gastroenterology)  Will change zantac to protonix (pt has not picked up prescription yet) and add carafate as previously discussed.  Pt to follow up with GI tomorrow.  Pt and husband agree with and  are comfortable with plan.    MDM   Final diagnoses:  Dysphagia    Afebrile, nontoxic patient with dysphagia.  Seen in ED yesterday for same.  CT neck negative.  Tolerating oral secretions without difficulty. No stridor. IVF, GI cocktail, protonix given.  Likely esophagitis.  Hx intermittent GERD.  Not immunocompromised, concern for infectious etiology.   D/C home with carafate, protonix.  GI follow up appointment with [facility] tomorrow.  Discussed result, findings, treatment, and follow up  with patient.  Pt given return precautions.  Pt verbalizes understanding and agrees with plan.        Trixie Dredge, PA-C 05/21/14 1501

## 2014-05-22 ENCOUNTER — Encounter: Payer: Self-pay | Admitting: Gastroenterology

## 2014-05-22 ENCOUNTER — Ambulatory Visit (INDEPENDENT_AMBULATORY_CARE_PROVIDER_SITE_OTHER): Payer: 59 | Admitting: Gastroenterology

## 2014-05-22 VITALS — BP 100/64 | HR 64 | Ht 61.5 in | Wt 146.1 lb

## 2014-05-22 DIAGNOSIS — R1312 Dysphagia, oropharyngeal phase: Secondary | ICD-10-CM

## 2014-05-22 NOTE — Patient Instructions (Signed)
Your appointment with [facility] with Dr Jenne PaneBates is scheduled on 05/22/2014 at 3pm arrive at 2:40pm

## 2014-05-22 NOTE — Progress Notes (Signed)
_                                                                                                                History of Present Illness:  [name] is a 43 year old white female referred for evaluation of dysphagia.  Approximately 3 weeks ago she developed fairly abrupt onset of difficulty initiating a swallow with dysphagia to solids and liquids.  She feels a fullness of the prep and is a sensation of fear of taking a deep breath.  Symptoms have remained stable.  She was seen in the ED yesterday where a CT of the neck was unremarkable as was her general exam.  There have been no change in medications and no recent upper respiratory symptoms.  She was started on Carafate and Protonix today.  She has only rare pyrosis.  She denies sore throat, neck or abdominal pain.   Past Medical History  Diagnosis Date  . 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 Hand pain, left 01/03/2011  . Overweight(278.02) 01/03/2011  . Right foot pain 01/03/2011  . Tinnitus, left 01/03/2011  . Vaginitis 02/02/2011  . Allergic conjunctivitis 03/25/2011  . Paresthesias 09/18/2011  . Chest pain   . GERD (gastroesophageal reflux disease)    Past Surgical History  Procedure Laterality Date  . Cesarean section      X 4  . Inner ear surgery  1997  . Appendectomy  1997  . Tubal ligation     family history includes COPD in her maternal grandfather; Cancer in her maternal grandfather; Dementia in her maternal grandmother; Heart disease in her father and maternal grandmother; Hypertension in her mother. There is no history of Colon cancer, Colon polyps, or Diabetes. Current Outpatient Prescriptions  Medication Sig Dispense Refill  . calcium carbonate (TUMS - DOSED IN MG ELEMENTAL CALCIUM) 500 MG chewable tablet Chew 2 tablets by mouth every 3 (three) hours as needed for indigestion or heartburn.      . pantoprazole (PROTONIX) 20 MG tablet Take 1 tablet (20 mg total) by mouth 2 (two) times daily.   30 tablet  0  . sucralfate (CARAFATE) 1 G tablet Take 1 tablet (1 g total) by mouth 4 (four) times daily -  with meals and at bedtime.  30 tablet  0  . valACYclovir (VALTREX) 1000 MG tablet Take 1 tablet (1,000 mg total) by mouth daily.  30 tablet  3  . [DISCONTINUED] ranitidine (ZANTAC) 150 MG capsule Take 1 capsule (150 mg total) by mouth daily.  30 capsule  0   No current facility-administered medications for this visit.   Allergies as of 05/22/2014 - Review Complete 05/22/2014  Allergen Reaction Noted  . Amoxicillin-pot clavulanate Rash 01/03/2011  . Famvir [famciclovir] Photosensitivity 02/07/2011  . Penicillins Rash 06/09/2011    reports that she has never smoked. She has never used smokeless tobacco. She reports that she does not drink alcohol or use illicit drugs.   Review of Systems: Pertinent positive and negative review of systems  were noted in the above HPI section. All other review of systems were otherwise negative.  Vital signs were reviewed in today's medical record Physical Exam: General: Well developed , well nourished, no acute distress Skin: anicteric Head: Normocephalic and atraumatic Eyes:  sclerae anicteric, EOMI Ears: Normal auditory acuity Mouth: No deformity or lesions Neck: Supple, no masses or thyromegaly Lungs: Clear throughout to auscultation Heart: Regular rate and rhythm; no murmurs, rubs or bruits Abdomen: Soft, non tender and non distended. No masses, hepatosplenomegaly or hernias noted. Normal Bowel sounds Rectal:deferred Musculoskeletal: Symmetrical with no gross deformities  Skin: No lesions on visible extremities Pulses:  Normal pulses noted Extremities: No clubbing, cyanosis, edema or deformities noted Neurological: Alert oriented x 4, grossly nonfocal Cervical Nodes:  No significant cervical adenopathy Inguinal Nodes: No significant inguinal adenopathy Psychological:  Alert and cooperative. Normal mood and affect  See Assessment and Plan  under Problem List

## 2014-05-22 NOTE — Assessment & Plan Note (Signed)
A. shows both oral pharyngeal dysphagia and dysphagia solids suggesting an inflammatory process in the proximal esophagus and perhaps the oropharyngeal area.  There no apparent abnormality is on exam.  I remain concerned about inflammation in the posterior pharynx and perhaps proximal cervical esophagus.  Esophageal stricture more distally is more apt to cause dysphagia but not difficulty with neck rotation.  Recommendations #1 stat ENT examination #2 if #1 is negative proceed with upper endoscopy #3 continue PPI therapy

## 2014-05-29 NOTE — ED Provider Notes (Signed)
Medical screening examination/treatment/procedure(s) were performed by non-physician practitioner and as supervising physician I was immediately available for consultation/collaboration.   EKG Interpretation None        Pavan Bring, MD 05/29/14 0657 

## 2014-06-03 ENCOUNTER — Other Ambulatory Visit: Payer: Self-pay | Admitting: Family Medicine

## 2014-06-03 ENCOUNTER — Telehealth: Payer: Self-pay | Admitting: Family Medicine

## 2014-06-03 DIAGNOSIS — R1310 Dysphagia, unspecified: Secondary | ICD-10-CM

## 2014-06-03 DIAGNOSIS — K2100 Gastro-esophageal reflux disease with esophagitis, without bleeding: Secondary | ICD-10-CM

## 2014-06-03 NOTE — Telephone Encounter (Signed)
[facility] I assume that is what she wanted.

## 2014-06-03 NOTE — Telephone Encounter (Signed)
Patient states ENT found she has damage from acid reflux.  She wants to be scoped and wants a referral to [facility]

## 2014-06-04 ENCOUNTER — Telehealth: Payer: Self-pay | Admitting: *Deleted

## 2014-06-04 NOTE — Telephone Encounter (Signed)
FYI Dr Arlyce DiceKaplan, Patient has been referred to [facility]  By her PCP

## 2014-06-04 NOTE — Telephone Encounter (Signed)
Message copied by Marlowe KaysSTALLINGS, Shailyn Weyandt M on Wed Jun 04, 2014 11:17 AM ------      Message from: Melvia HeapsKAPLAN, ROBERT D      Created: Fri May 23, 2014 10:17 AM       Please contact the patient in about a week to assess her progress.  If she is improving we will stay the course.  If she's still having dysphagia then we can proceed with upper endoscopy. ------

## 2014-06-04 NOTE — Telephone Encounter (Signed)
Ok

## 2014-06-23 ENCOUNTER — Encounter: Payer: Self-pay | Admitting: Gastroenterology

## 2014-09-24 ENCOUNTER — Other Ambulatory Visit: Payer: Self-pay | Admitting: Obstetrics and Gynecology

## 2014-09-25 LAB — CYTOLOGY - PAP

## 2014-10-15 ENCOUNTER — Other Ambulatory Visit: Payer: Self-pay | Admitting: Family

## 2015-01-02 ENCOUNTER — Telehealth: Payer: Self-pay | Admitting: Gastroenterology

## 2015-01-02 NOTE — Telephone Encounter (Signed)
Patient had similar issues in the fall of 2015. She was treated and improved. She did not go to [facility] to see GI. She was seen by ENT. Her symptoms returned 2 weeks ago. Feels like something is stuck in her throat , it hurts to swallow. Notes printed for an appointment on 01/07/15 with Amy Esterwood.

## 2015-01-07 ENCOUNTER — Ambulatory Visit: Payer: Self-pay | Admitting: Physician Assistant

## 2015-01-14 ENCOUNTER — Ambulatory Visit (INDEPENDENT_AMBULATORY_CARE_PROVIDER_SITE_OTHER): Payer: 59 | Admitting: Physician Assistant

## 2015-01-14 ENCOUNTER — Encounter: Payer: Self-pay | Admitting: Physician Assistant

## 2015-01-14 VITALS — BP 104/66 | HR 96 | Ht 61.5 in | Wt 140.2 lb

## 2015-01-14 DIAGNOSIS — R1310 Dysphagia, unspecified: Secondary | ICD-10-CM

## 2015-01-14 DIAGNOSIS — K219 Gastro-esophageal reflux disease without esophagitis: Secondary | ICD-10-CM | POA: Diagnosis not present

## 2015-01-14 NOTE — Patient Instructions (Signed)
You have been scheduled for a Barium Esophogram at [facility] (1st floor of the hospital) on 01/16/15 at 730 am. Please arrive 15 minutes prior to your appointment for registration. Make certain not to have anything to eat or drink 6 hours prior to your test. If you need to reschedule for any reason, please contact radiology at [phone] to do so. __________________________________________________________________ A barium swallow is an examination that concentrates on views of the esophagus. This tends to be a double contrast exam (barium and two liquids which, when combined, create a gas to distend the wall of the oesophagus) or single contrast (non-ionic iodine based). The study is usually tailored to your symptoms so a good history is essential. Attention is paid during the study to the form, structure and configuration of the esophagus, looking for functional disorders (such as aspiration, dysphagia, achalasia, motility and reflux) EXAMINATION You may be asked to change into a gown, depending on the type of swallow being performed. A radiologist and radiographer will perform the procedure. The radiologist will advise you of the type of contrast selected for your procedure and direct you during the exam. You will be asked to stand, sit or lie in several different positions and to hold a small amount of fluid in your mouth before being asked to swallow while the imaging is performed .In some instances you may be asked to swallow barium coated marshmallows to assess the motility of a solid food bolus. The exam can be recorded as a digital or video fluoroscopy procedure. POST PROCEDURE It will take 1-2 days for the barium to pass through your system. To facilitate this, it is important, unless otherwise directed, to increase your fluids for the next 24-48hrs and to resume your normal diet.  This test typically takes about 30 minutes to  perform. __________________________________________________________________________________ Nancy QuinYou have been scheduled for an endoscopy. Please follow written instructions given to you at your visit today. If you use inhalers (even only as needed), please bring them with you on the day of your procedure. Your physician has requested that you go to www.startemmi.com and enter the access code given to you at your visit today. This web site gives a general overview about your procedure. However, you should still follow specific instructions given to you by our office regarding your preparation for the procedure. Continue Omeprazole as directed.

## 2015-01-14 NOTE — Progress Notes (Addendum)
Patient ID: Nancy AoChristina [name], female   DOB: 03-Sep-1970, 44 y.o.   MRN: [id number]     History of Present Illness: [name]  is a pleasant 44 year old female who was evaluated in the office in October 2015 for dysphagia. At that time, she was complaining of a three-week history of difficulty initiating a swallow with dysphagia to solids and liquids. She complains of the sensation of a lump in her throat. She was referred to ENT, and was advised that if ENT did not find anything she should proceed with an upper endoscopy. She had been given a trial of a PPI and was advised to continue PPI therapy. She has had a CT of the soft tissue of the neck on 05/21/2014 which had no acute findings and no findings to explain her history of difficulty swallowing.  She says she saw Dr. Jenne PaneBates of ENT and was told she had reflux and was placed on a PPI and her symptoms improved. By November she felt better so she discontinued her PPI. She never followed up. About 3 weeks ago, she started having the sensation of a lump in her throat. She has trouble swallowing meats and dry foods. She couldn't push her foods down with water and has not had to spit food out. She occasionally has pain swallowing liquids. She describes the pain as a pressure in her throat. She does not feel as if her food is going down the wrong pipe. She restarted her twice a day omeprazole a few weeks ago but still has dysphagia. For an unknown reason she feel she was not taken seriously at her last visit and is insistent on not seeing her previous provider.  Past Medical History  Diagnosis Date  . 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 Hand pain, left 01/03/2011  . Overweight(278.02) 01/03/2011  . Right foot pain 01/03/2011  . Tinnitus, left 01/03/2011  . Vaginitis 02/02/2011  . Allergic conjunctivitis 03/25/2011  . Paresthesias 09/18/2011  . Chest pain   . GERD (gastroesophageal reflux disease)     Past Surgical History  Procedure  Laterality Date  . Cesarean section      X 4  . Inner ear surgery  1997  . Appendectomy  1997  . Tubal ligation     Family History  Problem Relation Age of Onset  . Hypertension Mother   . Heart disease Father     smoker  . Dementia Maternal Grandmother   . Heart disease Maternal Grandmother     s/p MI  . COPD Maternal Grandfather     smoker  . Cancer Maternal Grandfather     intestinal  . Colon cancer Neg Hx   . Colon polyps Neg Hx   . Diabetes Neg Hx    History  Substance Use Topics  . Smoking status: Never Smoker   . Smokeless tobacco: Never Used  . Alcohol Use: No   Current Outpatient Prescriptions  Medication Sig Dispense Refill  . Ascorbic Acid (VITAMIN C) 1000 MG tablet Take 1,000 mg by mouth daily.    . calcium carbonate (TUMS - DOSED IN MG ELEMENTAL CALCIUM) 500 MG chewable tablet Chew 2 tablets by mouth every 3 (three) hours as needed for indigestion or heartburn.    . cetirizine (ZYRTEC) 5 MG tablet Take 5 mg by mouth daily.    . cholecalciferol (VITAMIN D) 1000 UNITS tablet Take 1,000 Units by mouth daily.    . Multiple Vitamins-Minerals (MULTIVITAMIN ADULT PO) Take 1 tablet by  mouth daily. Pt takes One-A-Day brand    . omeprazole (PRILOSEC) 40 MG capsule Take 40 mg by mouth 2 (two) times daily.     . sucralfate (CARAFATE) 1 G tablet Take 1 tablet (1 g total) by mouth 4 (four) times daily -  with meals and at bedtime. 30 tablet 0  . valACYclovir (VALTREX) 1000 MG tablet TAKE 1 TABLET BY MOUTH ONCE DAILY 30 tablet 2  . [DISCONTINUED] ranitidine (ZANTAC) 150 MG capsule Take 1 capsule (150 mg total) by mouth daily. 30 capsule 0   No current facility-administered medications for this visit.   Allergies  Allergen Reactions  . Amoxicillin-Pot Clavulanate Rash  . Famvir [Famciclovir] Photosensitivity  . Penicillins Rash    Right forearm erythema on volar aspect only      Review of Systems: Gen: Denies any fever, chills, sweats, anorexia, fatigue, weakness,  malaise, weight loss, and sleep disorder CV: Denies chest pain, angina, palpitations, syncope, orthopnea, PND, peripheral edema, and claudication. Resp: Denies dyspnea at rest, dyspnea with exercise, cough, sputum, wheezing, coughing up blood, and pleurisy. GI: Denies vomiting blood, jaundice, and fecal incontinence.   Has dysphagia to solids and liquids. GU : Denies urinary burning, blood in urine, urinary frequency, urinary hesitancy, nocturnal urination, and urinary incontinence. MS: Denies joint pain, limitation of movement, and swelling, stiffness, low back pain, extremity pain. Denies muscle weakness, cramps, atrophy.  Derm: Denies rash, itching, dry skin, hives, moles, warts, or unhealing ulcers.  Psych: Denies depression, anxiety, memory loss, suicidal ideation, hallucinations, paranoia, and confusion. Heme: Denies bruising, bleeding, and enlarged lymph nodes. Neuro:  Denies any headaches, dizziness, paresthesia Endo:  Denies any problems with DM, thyroid, adrenal   Studies:  CLINICAL DATA: Difficulty swallowing. Unable to eat or drink.  EXAM: CT NECK WITH CONTRAST  TECHNIQUE: Multidetector CT imaging of the neck was performed using the standard protocol following the bolus administration of intravenous contrast.  CONTRAST: 75 cc Omnipaque 300  COMPARISON: No comparison studies available.  FINDINGS: Posterior soft tissues of the nasopharynx are symmetric and normal in appearance. No evidence for oro pharyngeal lesion. The epiglottis and aryepiglottic folds are normal. No vocal cord abnormality is evident. No evidence for tonsillar swelling or abscess. No prevertebral soft tissue swelling or abscess.  No lymphadenopathy in the neck. Thyroid gland is unremarkable. Bone windows have normal imaging features. The visualized portions of the paranasal sinuses are clear. Intraorbital fat is normal where visualized.  IMPRESSION: No acute findings in the neck.  Specifically, no findings to explain the patient's history of difficulty swallowing.   Electronically Signed  By: Kennith Center M.D.  On: 05/21/2014 11:32    Physical Exam: General: Pleasant, well developed female in no acute distress Head: Normocephalic and atraumatic Eyes:  sclerae anicteric, conjunctiva pink  Ears: Normal auditory acuity Lungs: Clear throughout to auscultation Heart: Regular rate and rhythm Abdomen: Soft, non distended, non-tender. No masses, no hepatomegaly. Normal bowel sounds Musculoskeletal: Symmetrical with no gross deformities  Extremities: No edema  Neurological: Alert oriented x 4, grossly nonfocal Psychological:  Alert and cooperative. Normal mood and affect  Assessment and Recommendations: 44 year old female with a history of GERD now with recurrent dysphagia and globus sensation, likely due to poorly controlled reflux. An antireflux regimen has been reviewed. She has been instructed to continue omeprazole 20 mg twice a day 30 minutes prior to breakfast and supper. She will be scheduled for a barium swallow with tablet. She will then be scheduled for an EGD to evaluate for esophagitis,  gastritis, ulcer, stricture etc.The risks, benefits, and alternatives to endoscopy with possible biopsy and possible dilation were discussed with the patient and they consent to proceed.  The procedure will be scheduled with Dr. Rhea Belton per patient request. Further recommendations will be made pending the findings of the above.   Emelly Wurtz, Tollie Pizza PA-C 01/14/2015,  Addendum: Reviewed and agree with initial management. Beverley Fiedler, MD

## 2015-01-16 ENCOUNTER — Ambulatory Visit (HOSPITAL_COMMUNITY)
Admission: RE | Admit: 2015-01-16 | Discharge: 2015-01-16 | Disposition: A | Discharge status: Home / Self Care | Payer: 59 | Source: Ambulatory Visit | Attending: Physician Assistant | Admitting: Physician Assistant

## 2015-01-16 DIAGNOSIS — R1310 Dysphagia, unspecified: Secondary | ICD-10-CM | POA: Diagnosis present

## 2015-01-22 ENCOUNTER — Encounter: Payer: 59 | Admitting: Internal Medicine

## 2015-01-22 ENCOUNTER — Encounter: Payer: Self-pay | Admitting: Internal Medicine

## 2015-01-22 ENCOUNTER — Ambulatory Visit (AMBULATORY_SURGERY_CENTER): Payer: 59 | Admitting: Internal Medicine

## 2015-01-22 VITALS — BP 114/76 | HR 70 | Temp 97.6°F | Resp 24 | Ht 61.0 in | Wt 140.0 lb

## 2015-01-22 DIAGNOSIS — K219 Gastro-esophageal reflux disease without esophagitis: Secondary | ICD-10-CM | POA: Diagnosis not present

## 2015-01-22 DIAGNOSIS — R0989 Other specified symptoms and signs involving the circulatory and respiratory systems: Secondary | ICD-10-CM

## 2015-01-22 DIAGNOSIS — K210 Gastro-esophageal reflux disease with esophagitis: Secondary | ICD-10-CM | POA: Diagnosis not present

## 2015-01-22 DIAGNOSIS — F458 Other somatoform disorders: Secondary | ICD-10-CM

## 2015-01-22 DIAGNOSIS — R1310 Dysphagia, unspecified: Secondary | ICD-10-CM

## 2015-01-22 HISTORY — PX: ESOPHAGOGASTRODUODENOSCOPY: SHX1529

## 2015-01-22 MED ORDER — SODIUM CHLORIDE 0.9 % IV SOLN: 500.0000 mL | INTRAVENOUS | Status: DC

## 2015-01-22 NOTE — Progress Notes (Signed)
Called to room to assist during endoscopic procedure.  Patient ID and intended procedure confirmed with present staff. Received instructions for my participation in the procedure from the performing physician.  

## 2015-01-22 NOTE — Op Note (Signed)
[facility] [address] [place], [id number]   ENDOSCOPY PROCEDURE REPORT  PATIENT: [name], [name]  MR#: [id number] BIRTHDATE: 1971-04-24 , 43  yrs. old GENDER: female ENDOSCOPIST: Beverley FiedlerJay M Almee Pelphrey, MD PROCEDURE DATE:  01/22/2015 PROCEDURE:  EGD, diagnostic and EGD w/ biopsy ASA CLASS:     Class II INDICATIONS:  globus sensation and history of GERD. MEDICATIONS: Monitored anesthesia care and Propofol 280 mg IV TOPICAL ANESTHETIC: none  DESCRIPTION OF PROCEDURE: After the risks benefits and alternatives of the procedure were thoroughly explained, informed consent was obtained.  The LB [id number] endoscope was introduced through the mouth and advanced to the second portion of the duodenum , Without limitations.  The instrument was slowly withdrawn as the mucosa was fully examined.  ESOPHAGUS: The z-line was located 36cm from the incisors.  The z line appeared irregular and biopsies were taken to evaluate for Barrett's metaplasia.  If confirmed C2M2.  The GEJ is widely patent.  The mucosa of the esophagus appeared otherwise normal. Biopsies were taken in the proximal, mid, and distal esophagus for eosinophilic esophagitis.  STOMACH: The mucosa of the stomach appeared normal.  DUODENUM: The duodenal mucosa showed no abnormalities. Retroflexed views revealed no abnormalities.     The scope was then withdrawn from the patient and the procedure completed.  COMPLICATIONS: There were no immediate complications.  ENDOSCOPIC IMPRESSION: 1.   The z-line was located 36cm from the incisors and irregular raising question of Barrett's esophagus;  multiple biopsies 2.  The mucosa of the esophagus appeared otherwise normal; biopsies taken for eosinophilic esophagitis 3.   The mucosa of the stomach appeared normal 4.   The duodenal mucosa showed no abnormalities  RECOMMENDATIONS: 1.  Await biopsy results 2.  Continue omeprazole 40 mg once daily (best taken 30  minutes before the 1st meal of the day) as acid reflux disease is felt most likely explanation for globus sensation  eSigned:  Beverley FiedlerJay M Keidan Aumiller, MD 01/22/2015 3:14 PM    CC: the patient, Dr. Abner GreenspanBlyth

## 2015-01-22 NOTE — Progress Notes (Signed)
Transferred to recovery room. A/O x3, pleased with MAC.  VSS.  Report to Jill, RN. 

## 2015-01-22 NOTE — Patient Instructions (Signed)
YOU HAD AN ENDOSCOPIC PROCEDURE TODAY AT THE [facility]:   Refer to the procedure report that was given to you for any specific questions about what was found during the examination.  If the procedure report does not answer your questions, please call your gastroenterologist to clarify.  If you requested that your care partner not be given the details of your procedure findings, then the procedure report has been included in a sealed envelope for you to review at your convenience later.  YOU SHOULD EXPECT: Some feelings of bloating in the abdomen. Passage of more gas than usual.  Walking can help get rid of the air that was put into your GI tract during the procedure and reduce the bloating. If you had a lower endoscopy (such as a colonoscopy or flexible sigmoidoscopy) you may notice spotting of blood in your stool or on the toilet paper. If you underwent a bowel prep for your procedure, you may not have a normal bowel movement for a few days.  Please Note:  You might notice some irritation and congestion in your nose or some drainage.  This is from the oxygen used during your procedure.  There is no need for concern and it should clear up in a day or so.  SYMPTOMS TO REPORT IMMEDIATELY:  Following upper endoscopy (EGD)  Vomiting of blood or coffee ground material  New chest pain or pain under the shoulder blades  Painful or persistently difficult swallowing  New shortness of breath  Fever of 100F or higher  Black, tarry-looking stools  For urgent or emergent issues, a gastroenterologist can be reached at any hour by calling [phone].   DIET: Your first meal following the procedure should be a small meal and then it is ok to progress to your normal diet. Heavy or fried foods are harder to digest and may make you feel nauseous or bloated.  Likewise, meals heavy in dairy and vegetables can increase bloating.  Drink plenty of fluids but you should avoid alcoholic beverages for 24  hours.  ACTIVITY:  You should plan to take it easy for the rest of today and you should NOT DRIVE or use heavy machinery until tomorrow (because of the sedation medicines used during the test).    FOLLOW UP: Our staff will call the number listed on your records the next business day following your procedure to check on you and address any questions or concerns that you may have regarding the information given to you following your procedure. If we do not reach you, we will leave a message.  However, if you are feeling well and you are not experiencing any problems, there is no need to return our call.  We will assume that you have returned to your regular daily activities without incident.  If any biopsies were taken you will be contacted by phone or by letter within the next 1-3 weeks.  Please call us at [phone] if you have not heard about the biopsies in 3 weeks.    SIGNATURES/CONFIDENTIALITY: You and/or your care partner have signed paperwork which will be entered into your electronic medical record.  These signatures attest to the fact that that the information above on your After Visit Summary has been reviewed and is understood.  Full responsibility of the confidentiality of this discharge information lies with you and/or your care-partner.  Discharge instructions given Continue Omeprazole 40 mg tab once daily( Best taken 30 minutes before the 1st meal of the day)  Resume regular diet Await biopsy results

## 2015-01-23 ENCOUNTER — Telehealth: Payer: Self-pay | Admitting: *Deleted

## 2015-01-23 NOTE — Telephone Encounter (Signed)
  Follow up Call-  Call back number 01/22/2015  Post procedure Call Back phone  # [phone]  Permission to leave phone message Yes     Patient questions:  Message left to call us if necessary.

## 2015-02-02 ENCOUNTER — Telehealth: Payer: Self-pay | Admitting: Internal Medicine

## 2015-02-02 ENCOUNTER — Encounter: Payer: Self-pay | Admitting: Internal Medicine

## 2015-02-02 NOTE — Telephone Encounter (Signed)
Spoke to patient by phone regarding pathology results -- benign esophageal cells without evidence of eosinophilic esophagitis -- distal esophagus/ GE junction biopsies  Chronic inflammation consistent with reflux but no Barrett's   likely she is feeling much better on pantoprazole 40 mg daily with improvement in globus and dysphagia.  Plan to continue pantoprazole 40 mg daily for 1-2 months and then she can try to discontinue. She was asked to notify me if she develops recurrent globus sensation or dysphagia off of PPI. She voiced understanding , is happy with the plan. Time provided for questions and answers and she thanked me for the call

## 2015-02-15 ENCOUNTER — Emergency Department (HOSPITAL_COMMUNITY)
Admission: EM | Admit: 2015-02-15 | Discharge: 2015-02-15 | Disposition: A | Discharge status: Home / Self Care | Payer: 59 | Attending: Emergency Medicine | Admitting: Emergency Medicine

## 2015-02-15 ENCOUNTER — Encounter (HOSPITAL_COMMUNITY): Payer: Self-pay

## 2015-02-15 DIAGNOSIS — K219 Gastro-esophageal reflux disease without esophagitis: Secondary | ICD-10-CM | POA: Insufficient documentation

## 2015-02-15 DIAGNOSIS — Z8669 Personal history of other diseases of the nervous system and sense organs: Secondary | ICD-10-CM | POA: Insufficient documentation

## 2015-02-15 DIAGNOSIS — Z8742 Personal history of other diseases of the female genital tract: Secondary | ICD-10-CM | POA: Diagnosis not present

## 2015-02-15 DIAGNOSIS — E663 Overweight: Secondary | ICD-10-CM | POA: Insufficient documentation

## 2015-02-15 DIAGNOSIS — Z79899 Other long term (current) drug therapy: Secondary | ICD-10-CM | POA: Insufficient documentation

## 2015-02-15 DIAGNOSIS — R202 Paresthesia of skin: Secondary | ICD-10-CM

## 2015-02-15 DIAGNOSIS — Z8619 Personal history of other infectious and parasitic diseases: Secondary | ICD-10-CM | POA: Insufficient documentation

## 2015-02-15 DIAGNOSIS — Z880 Allergy status to penicillin: Secondary | ICD-10-CM | POA: Insufficient documentation

## 2015-02-15 DIAGNOSIS — M79671 Pain in right foot: Secondary | ICD-10-CM | POA: Diagnosis present

## 2015-02-15 LAB — BASIC METABOLIC PANEL
ANION GAP: 7 (ref 5–15)
BUN: 20 mg/dL (ref 6–20)
CHLORIDE: 103 mmol/L (ref 101–111)
CO2: 26 mmol/L (ref 22–32)
Calcium: 8.9 mg/dL (ref 8.9–10.3)
Creatinine, Ser: 0.71 mg/dL (ref 0.44–1.00)
GFR calc non Af Amer: >60 mL/min (ref >60)
Glucose, Bld: 95 mg/dL (ref 65–99)
Potassium: 4.3 mmol/L (ref 3.5–5.1)
Sodium: 136 mmol/L (ref 135–145)

## 2015-02-15 LAB — CBC
HCT: 36 % (ref 36.0–46.0)
HEMOGLOBIN: 11.9 g/dL — AB (ref 12.0–15.0)
MCH: 29.2 pg (ref 26.0–34.0)
MCHC: 33.1 g/dL (ref 30.0–36.0)
MCV: 88.5 fL (ref 78.0–100.0)
PLATELETS: 196 10*3/uL (ref 150–400)
RBC: 4.07 MIL/uL (ref 3.87–5.11)
RDW: 15.5 % (ref 11.5–15.5)
WBC: 7.9 10*3/uL (ref 4.0–10.5)

## 2015-02-15 MED ORDER — NAPROXEN 500 MG PO TABS: 500.0000 mg | ORAL_TABLET | Freq: Two times a day (BID) | ORAL | Status: DC

## 2015-02-15 MED ORDER — KETOROLAC TROMETHAMINE 60 MG/2ML IM SOLN
60.0000 mg | Freq: Once | INTRAMUSCULAR | Status: AC
  Administered 2015-02-15: 60 mg via INTRAMUSCULAR
  Filled 2015-02-15: qty 2

## 2015-02-15 NOTE — ED Provider Notes (Signed)
CSN: [id number]     Arrival date & time 02/15/15  1006 History   First MD Initiated Contact with Patient 02/15/15 1012     Chief Complaint  Patient presents with  . Foot Pain     (Consider location/radiation/quality/duration/timing/severity/associated sxs/prior Treatment) HPI Comments: The patient is a 44 year old female who has just returned from a cruise in the [place] during which time she developed mild hyperesthesias to her feet followed by diffuse hyperesthesias to her entire body. This is mild, persistent, not associated with paradoxical temperature changes, no nausea vomiting fevers chills abdominal pain back pain dysuria or diarrhea. She has no speech changes, no vision changes, no difficulty with balance. The symptoms are persistent, she saw an urgent care during which time they thought she may have a fungal infection despite not having a rash and started her on ketoconazole and terbinafine topical medications. This has not improved her symptoms, that it does seem to get worse when she is in the sunlight.  Patient is a 44 y.o. female presenting with lower extremity pain. The history is provided by the patient.  Foot Pain    Past Medical History  Diagnosis Date  . 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 Hand pain, left 01/03/2011  . Overweight(278.02) 01/03/2011  . Right foot pain 01/03/2011  . Tinnitus, left 01/03/2011  . Vaginitis 02/02/2011  . Allergic conjunctivitis 03/25/2011  . Paresthesias 09/18/2011  . Chest pain   . GERD (gastroesophageal reflux disease)    Past Surgical History  Procedure Laterality Date  . Cesarean section      X 4  . Inner ear surgery  1997  . Appendectomy  1997  . Tubal ligation     Family History  Problem Relation Age of Onset  . Hypertension Mother   . Heart disease Father     smoker  . Dementia Maternal Grandmother   . Heart disease Maternal Grandmother     s/p MI  . COPD Maternal Grandfather     smoker  . Cancer Maternal  Grandfather     intestinal  . Colon cancer Neg Hx   . Colon polyps Neg Hx   . Diabetes Neg Hx    History  Substance Use Topics  . Smoking status: Never Smoker   . Smokeless tobacco: Never Used  . Alcohol Use: No   OB History    Gravida Para Term Preterm AB TAB SAB Ectopic Multiple Living   9 4 4  5  5   4      Review of Systems  All other systems reviewed and are negative.     Allergies  Amoxicillin-pot clavulanate; Famvir; and Penicillins  Home Medications   Prior to Admission medications   Medication Sig Start Date End Date Taking? Authorizing Provider  Ascorbic Acid (VITAMIN C) 1000 MG tablet Take 1,000 mg by mouth daily.   Yes Historical Provider, MD  cetirizine (ZYRTEC) 10 MG tablet Take 10 mg by mouth daily.   Yes Historical Provider, MD  cholecalciferol (VITAMIN D) 1000 UNITS tablet Take 1,000 Units by mouth daily.   Yes Historical Provider, MD  Multiple Vitamins-Minerals (MULTIVITAMIN ADULT PO) Take 1 tablet by mouth daily. Pt takes One-A-Day brand   Yes Historical Provider, MD  omeprazole (PRILOSEC) 40 MG capsule Take 40 mg by mouth daily.  01/05/15  Yes Historical Provider, MD  valACYclovir (VALTREX) 1000 MG tablet TAKE 1 TABLET BY MOUTH ONCE DAILY 10/16/14  Yes Bradd Canary, MD  naproxen (NAPROSYN) 500  MG tablet Take 1 tablet (500 mg total) by mouth 2 (two) times daily with a meal. 02/15/15   Eber Hong, MD   BP 104/57 mmHg  Pulse 73  Temp(Src) 98.1 F (36.7 C)  Resp 18  SpO2 97%  LMP 01/10/2015 Physical Exam  Constitutional: She appears well-developed and well-nourished. No distress.  HENT:  Head: Normocephalic and atraumatic.  Mouth/Throat: Oropharynx is clear and moist. No oropharyngeal exudate.  Eyes: Conjunctivae and EOM are normal. Pupils are equal, round, and reactive to light. Right eye exhibits no discharge. Left eye exhibits no discharge. No scleral icterus.  Neck: Normal range of motion. Neck supple. No JVD present. No thyromegaly present.   Cardiovascular: Normal rate, regular rhythm, normal heart sounds and intact distal pulses.  Exam reveals no gallop and no friction rub.   No murmur heard. Pulmonary/Chest: Effort normal and breath sounds normal. No respiratory distress. She has no wheezes. She has no rales.  Abdominal: Soft. Bowel sounds are normal. She exhibits no distension and no mass. There is no tenderness.  Musculoskeletal: Normal range of motion. She exhibits no edema or tenderness.  Lymphadenopathy:    She has no cervical adenopathy.  Neurological: She is alert. Coordination normal.  Skin: Skin is warm and dry. No rash noted. No erythema.  Psychiatric: She has a normal mood and affect. Her behavior is normal.  Nursing note and vitals reviewed.   ED Course  Procedures (including critical care time) Labs Review Labs Reviewed  CBC - Abnormal; Notable for the following:    Hemoglobin 11.9 (*)    All other components within normal limits  BASIC METABOLIC PANEL    Imaging Review No results found.   MDM   Final diagnoses:  Paresthesias    The patient has normal-appearing skin, her neurologic exam is totally unremarkable, her gait is normal, speech is normal, cardiac pulmonary and abdominal exams are normal, vital signs are normal, she does have a tendency to be anemic, rule out anemia, allegedly disturbance, unlikely to be multiple sclerosis, patient given warning that this is a potential possibility and needs to have this followed up if symptoms do not improve. Less likely to be ciguatera given the fact that the patient and 3 other people all ate the same food and she is the only one with the symptoms.  The patient was informed of her lab results, they show no significant findings, she has been given a copy of these results to share with her doctor, I have encouraged an outpatient MRI if her symptoms do not improve. The patient is in agreement with the plan.  Eber Hong, MD 02/15/15 1159

## 2015-02-15 NOTE — Discharge Instructions (Signed)
Please call your doctor for a followup appointment within 24-48 hours. When you talk to your doctor please let them know that you were seen in the emergency department and have them acquire all of your records so that they can discuss the findings with you and formulate a treatment plan to fully care for your new and ongoing problems. ° °

## 2015-02-15 NOTE — ED Notes (Signed)
Pt. Presents with complaint of new onset burning sensation to R foot. Pt. States it began when they were on a cruise this past week. Pt. States that she walked a lot while at theme parks in [place]. States she has hx of neuropathy. Denies hx of DM. States she is on no medication for burning. Pt. Ambulatory, AxO x4.

## 2015-02-16 ENCOUNTER — Encounter: Payer: Self-pay | Admitting: Medical

## 2015-02-16 ENCOUNTER — Ambulatory Visit (INDEPENDENT_AMBULATORY_CARE_PROVIDER_SITE_OTHER): Payer: 59 | Admitting: Medical

## 2015-02-16 VITALS — BP 112/57 | HR 74 | Temp 98.5°F | Ht 61.5 in | Wt 146.8 lb

## 2015-02-16 DIAGNOSIS — D649 Anemia, unspecified: Secondary | ICD-10-CM

## 2015-02-16 DIAGNOSIS — R202 Paresthesia of skin: Secondary | ICD-10-CM | POA: Diagnosis not present

## 2015-02-16 LAB — VITAMIN B12: Vitamin B-12: 415 pg/mL (ref 211–911)

## 2015-02-16 LAB — FOLATE: Folate: 19.3 ng/mL

## 2015-02-16 LAB — SEDIMENTATION RATE: Sed Rate: 25 mm/hr — ABNORMAL HIGH (ref 0–22)

## 2015-02-16 MED ORDER — GABAPENTIN 100 MG PO CAPS: 100.0000 mg | ORAL_CAPSULE | Freq: Three times a day (TID) | ORAL | Status: DC

## 2015-02-16 MED ORDER — TRAMADOL HCL 50 MG PO TABS: 50.0000 mg | ORAL_TABLET | Freq: Four times a day (QID) | ORAL | Status: DC | PRN

## 2015-02-16 NOTE — Patient Instructions (Addendum)
Paresthesias Hx of paresthesias with recent report of burning of foot with faint tingle sensation. Some tingle upper back and shoulder region as well.   Will get rpr, sed rate, b12, folate and b1    You do report some burn/pain sensation so will rx neurontin to see if this gives some refief.  Will go ahead and refer to neurologist for further work up since atypical symptosm in 2013 and now recurrent.  Follow up during the interim with Dr. Abner Greenspan or as needed with myself.   Brief rx of tramadol to help with her sensation pain/burn.(States sensation irritating enough that she almost wants to cry)

## 2015-02-16 NOTE — Assessment & Plan Note (Signed)
Hx of paresthesias with recent report of burning of foot with faint tingle sensation. Some tingle upper back and shoulder region as well.   Will get rpr, sed rate, b12, folate and b1

## 2015-02-16 NOTE — Progress Notes (Signed)
Pre visit review using our clinic review tool, if applicable. No additional management support is needed unless otherwise documented below in the visit note. 

## 2015-02-16 NOTE — Progress Notes (Signed)
Subjective:    Patient ID: [name], female    DOB: 07/16/1971, 44 y.o.   MRN: [id number]  HPI  Pt seen just yesterday in ED. I did review notes and work up.  Pt is in stating she feels the same as before. Pt states she has sensation of burning to her feet. Mostly bottom of the foot. Feels like someone put acid on foot. But no trauma or injury(on cruise recenlty. Pt does not remember stepping on any marine life/jelly fish). However she does note walking on some very hot pavement but no obvious burn. Pt mentions some upper back tingling. And shoulder. But no gross motor deficits. Pt pt speech and vision is normal. No cardiac, pulmonary or abdominal or allergy type symptoms. No urinary incontinence.  While on cruise 3 person with her on the trip all ate seafood. No one has similar symptom. LMP- January 31, 2015.   Also no lumbar area pain.   January 2013. Pt has some paresthesias of rt arm, and leg intermittently. Dr. Abner Greenspan did check some labs.    Review of Systems  Constitutional: Negative for chills, appetite change, fatigue and unexpected weight change.  HENT: Negative.   Respiratory: Negative for cough and chest tightness.   Cardiovascular: Negative for chest pain and palpitations.  Gastrointestinal: Negative for abdominal pain.  Musculoskeletal:       No weakness of arms or legs.   Skin: Negative for pallor and rash.  Neurological: Negative for dizziness, syncope, weakness, light-headedness, numbness and headaches.       Rt foot burning. Paresthesia type sensation. Upper back and shoulder mild tingling.  Hematological: Negative for adenopathy. Does not bruise/bleed easily.  Psychiatric/Behavioral: Negative for suicidal ideas, behavioral problems, dysphoric mood and decreased concentration.     Past Medical History  Diagnosis Date  . 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 Hand pain, left 01/03/2011  . Overweight(278.02) 01/03/2011  . Right foot pain 01/03/2011    . Tinnitus, left 01/03/2011  . Vaginitis 02/02/2011  . Allergic conjunctivitis 03/25/2011  . Paresthesias 09/18/2011  . Chest pain   . GERD (gastroesophageal reflux disease)     History   Social History  . Marital Status: Married    Spouse Name: N/A  . Number of Children: 4  . Years of Education: N/A   Occupational History  . 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 Smoking status: Never Smoker   . Smokeless tobacco: Never Used  . Alcohol Use: No  . Drug Use: No  . Sexual Activity:    Partners: Male   Other Topics Concern  . Not on file   Social History Narrative    Past Surgical History  Procedure Laterality Date  . Cesarean section      X 4  . Inner ear surgery  1997  . Appendectomy  1997  . Tubal ligation      Family History  Problem Relation Age of Onset  . Hypertension Mother   . Heart disease Father     smoker  . Dementia Maternal Grandmother   . Heart disease Maternal Grandmother     s/p MI  . COPD Maternal Grandfather     smoker  . Cancer Maternal Grandfather     intestinal  . Colon cancer Neg Hx   . Colon polyps Neg Hx   . Diabetes Neg Hx     Allergies  Allergen Reactions  . Amoxicillin-Pot Clavulanate Rash  . Famvir [Famciclovir]  Photosensitivity  . Penicillins Rash    Right forearm erythema on volar aspect only    Current Outpatient Prescriptions on File Prior to Visit  Medication Sig Dispense Refill  . Ascorbic Acid (VITAMIN C) 1000 MG tablet Take 1,000 mg by mouth daily.    . cetirizine (ZYRTEC) 10 MG tablet Take 10 mg by mouth daily.    . cholecalciferol (VITAMIN D) 1000 UNITS tablet Take 1,000 Units by mouth daily.    . Multiple Vitamins-Minerals (MULTIVITAMIN ADULT PO) Take 1 tablet by mouth daily. Pt takes One-A-Day brand    . naproxen (NAPROSYN) 500 MG tablet Take 1 tablet (500 mg total) by mouth 2 (two) times daily with a meal. 30 tablet 0  . omeprazole (PRILOSEC) 40 MG capsule Take 40 mg by mouth daily.     .  valACYclovir (VALTREX) 1000 MG tablet TAKE 1 TABLET BY MOUTH ONCE DAILY 30 tablet 2  . [DISCONTINUED] ranitidine (ZANTAC) 150 MG capsule Take 1 capsule (150 mg total) by mouth daily. 30 capsule 0   No current facility-administered medications on file prior to visit.    BP 112/57 mmHg  Pulse 74  Temp(Src) 98.5 F (36.9 C) (Oral)  Ht 5' 1.5" (1.562 m)  Wt 146 lb 12.8 oz (66.588 kg)  BMI 27.29 kg/m2  SpO2 100%  LMP 01/31/2015       Objective:   Physical Exam  General Mental Status- Alert. General Appearance- Not in acute distress.   Skin General: Color- Normal Color. Moisture- Normal Moisture.  Neck Carotid Arteries- Normal color. Moisture- Normal Moisture. No JVD.  Chest and Lung Exam Auscultation: Breath Sounds:-Normal. CTA.  Cardiovascular Auscultation:Rythm- Regular, RATe and rythm Murmurs & Other Heart Sounds:Auscultation of the heart reveals- No Murmurs.  Abdomen Inspection:-Inspeection Normal. Palpation/Percussion:Note:No mass. Palpation and Percussion of the abdomen reveal- Non Tender, Non Distended + BS, no rebound or guarding.    Neurologic Cranial Nerve exam:- CN III-XII intact(No nystagmus), symmetric smile. Drift Test:- No drift. Romberg Exam:- Negative.  Heal to Toe Gait exam:-Normal. Finger to Nose:- Normal/Intact Strength:- 5/5 equal and symmetric strength both upper and lower extremities.  Foot exam- good pulses both sides .Good capillary refill. Sharp and dull discrimination intact. No redness, no swelling, No lesions, no breakdown. Rt foot one in question with symptoms appears normal.   On both feet pt had some difficulty with sharp and dull discrimination rt and left foot(bottome aspect)     Assessment & Plan:

## 2015-02-17 LAB — RPR

## 2015-02-17 LAB — HIV ANTIBODY (ROUTINE TESTING W REFLEX): HIV 1&2 Ab, 4th Generation: NONREACTIVE

## 2015-02-19 ENCOUNTER — Telehealth: Payer: Self-pay | Admitting: Medical

## 2015-02-19 ENCOUNTER — Encounter: Payer: Self-pay | Admitting: Medical

## 2015-02-19 NOTE — Telephone Encounter (Signed)
Rx on her call in complaint. Burning sensation. Advise she can increase neurontin to 200 mg 3 times a day.

## 2015-02-20 ENCOUNTER — Ambulatory Visit (INDEPENDENT_AMBULATORY_CARE_PROVIDER_SITE_OTHER): Payer: 59 | Admitting: Family Medicine

## 2015-02-20 ENCOUNTER — Encounter: Payer: Self-pay | Admitting: Family Medicine

## 2015-02-20 ENCOUNTER — Ambulatory Visit (HOSPITAL_BASED_OUTPATIENT_CLINIC_OR_DEPARTMENT_OTHER)
Admission: RE | Admit: 2015-02-20 | Discharge: 2015-02-20 | Disposition: A | Discharge status: Home / Self Care | Payer: 59 | Source: Ambulatory Visit | Attending: Family Medicine | Admitting: Family Medicine

## 2015-02-20 VITALS — BP 108/64 | HR 91 | Temp 97.9°F | Ht 61.5 in | Wt 146.4 lb

## 2015-02-20 DIAGNOSIS — M545 Low back pain, unspecified: Secondary | ICD-10-CM

## 2015-02-20 DIAGNOSIS — R350 Frequency of micturition: Secondary | ICD-10-CM

## 2015-02-20 DIAGNOSIS — M791 Myalgia: Secondary | ICD-10-CM | POA: Diagnosis not present

## 2015-02-20 DIAGNOSIS — M7918 Myalgia, other site: Secondary | ICD-10-CM

## 2015-02-20 DIAGNOSIS — E663 Overweight: Secondary | ICD-10-CM | POA: Diagnosis not present

## 2015-02-20 LAB — URINALYSIS
Bilirubin Urine: NEGATIVE
HGB URINE DIPSTICK: NEGATIVE
KETONES UR: NEGATIVE
Leukocytes, UA: NEGATIVE
Nitrite: NEGATIVE
SPECIFIC GRAVITY, URINE: 1.015 (ref 1.000–1.030)
URINE GLUCOSE: NEGATIVE
UROBILINOGEN UA: 0.2 (ref 0.0–1.0)
pH: 8 (ref 5.0–8.0)

## 2015-02-20 MED ORDER — VALACYCLOVIR HCL 1 G PO TABS: 1000.0000 mg | ORAL_TABLET | Freq: Every day | ORAL | Status: DC

## 2015-02-20 MED ORDER — CYCLOBENZAPRINE HCL 10 MG PO TABS: 10.0000 mg | ORAL_TABLET | Freq: Every evening | ORAL | Status: DC | PRN

## 2015-02-20 NOTE — Progress Notes (Signed)
Pre visit review using our clinic review tool, if applicable. No additional management support is needed unless otherwise documented below in the visit note. 

## 2015-02-20 NOTE — Patient Instructions (Signed)
Back Pain, Adult Low back pain is very common. About 1 in 5 people have back pain.The cause of low back pain is rarely dangerous. The pain often gets better over time.About half of people with a sudden onset of back pain feel better in just 2 weeks. About 8 in 10 people feel better by 6 weeks.  CAUSES Some common causes of back pain include:  Strain of the muscles or ligaments supporting the spine.  Wear and tear (degeneration) of the spinal discs.  Arthritis.  Direct injury to the back. DIAGNOSIS Most of the time, the direct cause of low back pain is not known.However, back pain can be treated effectively even when the exact cause of the pain is unknown.Answering your caregiver's questions about your overall health and symptoms is one of the most accurate ways to make sure the cause of your pain is not dangerous. If your caregiver needs more information, he or she may order lab work or imaging tests (X-rays or MRIs).However, even if imaging tests show changes in your back, this usually does not require surgery. HOME CARE INSTRUCTIONS For many people, back pain returns.Since low back pain is rarely dangerous, it is often a condition that people can learn to manageon their own.   Remain active. It is stressful on the back to sit or stand in one place. Do not sit, drive, or stand in one place for more than 30 minutes at a time. Take short walks on level surfaces as soon as pain allows.Try to increase the length of time you walk each day.  Do not stay in bed.Resting more than 1 or 2 days can delay your recovery.  Do not avoid exercise or work.Your body is made to move.It is not dangerous to be active, even though your back may hurt.Your back will likely heal faster if you return to being active before your pain is gone.  Pay attention to your body when you bend and lift. Many people have less discomfortwhen lifting if they bend their knees, keep the load close to their bodies,and  avoid twisting. Often, the most comfortable positions are those that put less stress on your recovering back.  Find a comfortable position to sleep. Use a firm mattress and lie on your side with your knees slightly bent. If you lie on your back, put a pillow under your knees.  Only take over-the-counter or prescription medicines as directed by your caregiver. Over-the-counter medicines to reduce pain and inflammation are often the most helpful.Your caregiver may prescribe muscle relaxant drugs.These medicines help dull your pain so you can more quickly return to your normal activities and healthy exercise.  Put ice on the injured area.  Put ice in a plastic bag.  Place a towel between your skin and the bag.  Leave the ice on for 15-20 minutes, 03-04 times a day for the first 2 to 3 days. After that, ice and heat may be alternated to reduce pain and spasms.  Ask your caregiver about trying back exercises and gentle massage. This may be of some benefit.  Avoid feeling anxious or stressed.Stress increases muscle tension and can worsen back pain.It is important to recognize when you are anxious or stressed and learn ways to manage it.Exercise is a great option. SEEK MEDICAL CARE IF:  You have pain that is not relieved with rest or medicine.  You have pain that does not improve in 1 week.  You have new symptoms.  You are generally not feeling well. SEEK   IMMEDIATE MEDICAL CARE IF:   You have pain that radiates from your back into your legs.  You develop new bowel or bladder control problems.  You have unusual weakness or numbness in your arms or legs.  You develop nausea or vomiting.  You develop abdominal pain.  You feel faint. Document Released: 08/08/2005 Document Revised: 02/07/2012 Document Reviewed: 12/10/2013 ExitCare Patient Information 2015 [employer]. This information is not intended to replace advice given to you by your health care provider. Make sure you  discuss any questions you have with your health care provider.  

## 2015-02-22 LAB — URINE CULTURE
Colony Count: NO GROWTH
ORGANISM ID, BACTERIA: NO GROWTH

## 2015-02-26 ENCOUNTER — Encounter: Payer: Self-pay | Admitting: Neurology

## 2015-02-26 ENCOUNTER — Ambulatory Visit (INDEPENDENT_AMBULATORY_CARE_PROVIDER_SITE_OTHER): Payer: 59 | Admitting: Neurology

## 2015-02-26 VITALS — BP 108/68 | HR 71 | Ht 61.5 in | Wt 150.4 lb

## 2015-02-26 DIAGNOSIS — R202 Paresthesia of skin: Secondary | ICD-10-CM | POA: Diagnosis not present

## 2015-02-26 DIAGNOSIS — M5441 Lumbago with sciatica, right side: Secondary | ICD-10-CM | POA: Diagnosis not present

## 2015-02-26 NOTE — Patient Instructions (Addendum)
1.  Start physical therapy for low back pain  2.  Take flexeril 2.5mg  (half-tablet) at bedtime for severe low back pain 3.  If there is any worsening of your symptoms, please come back and see me

## 2015-02-26 NOTE — Progress Notes (Signed)
[facility] Clinic Note - Initial Visit   Date: 02/26/2015   [name] MRN: [id number] DOB: 09/19/1970   Dear Dr. Charlett Blake:  Thank you for your kind referral of [name] for consultation of right foot paresthesias. Although her history is well known to you, please allow [place] to reiterate it for the purpose of our medical record. The patient was accompanied to the clinic by self.  History of Present Illness: [name] is a 44 y.o. 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  She was on a [employer] in June and on the 19th, she developed burning sensation of the right distal toes.  Symptoms were constant and improved with ice and NSAIDs. She was initially thought to have fungal infection and was given topical and oral antifungal agent without benefit.  She went to the emergency department at [facility] on 6/26 because of worsening pain and new symptoms of burning pain in the right thigh and tingling of the right arm.  There was low clinical suspicion for MS, so she was asked to follow-up with her PCP.  Labs for RPR, HIV, vitamin B12, folate, and ESR returned normal.  She recalled that she was carrying her son during her trip and developed low back pain after which symptoms started.  She went to massage therapy and accupunture therapist which loosened her muscles.  Previously she was taking ibuprofen 439m three times daily and has been able to reduce it to one tablet daily.  MRI lumbar spine is scheduled for Saturday.    Overall, she feels much better and functioning well.  Her feet pain has improved and ranks it as 3/10 when it is there, but sometimes she does not notice it at all.   She does continue to use ice to her low back.  No associated weakness or bowel/bladder incontinence.    Out-side paper records, electronic medical record, and images have been reviewed where available and  summarized as:  Labs 02/16/2015:  RPR NR, HIV NR, vitamin B12 415, folate 19.3, ESR 25  XR lumbar spine 02/20/2015: There is no acute or significant chronic bony abnormality of the lumbar spine.    Past Medical History  Diagnosis Date  . 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 Hand pain, left 01/03/2011  . Overweight(278.02) 01/03/2011  . Right foot pain 01/03/2011  . Tinnitus, left 01/03/2011  . Vaginitis 02/02/2011  . Allergic conjunctivitis 03/25/2011  . Paresthesias 09/18/2011  . Chest pain   . GERD (gastroesophageal reflux disease)     Past Surgical History  Procedure Laterality Date  . Cesarean section      X 4  . Inner ear surgery  1997  . Appendectomy  1997  . Tubal ligation       Medications:  Current Outpatient Prescriptions on File Prior to Visit  Medication Sig Dispense Refill  . Ascorbic Acid (VITAMIN C) 1000 MG tablet Take 1,000 mg by mouth daily.    . cetirizine (ZYRTEC) 10 MG tablet Take 10 mg by mouth daily.    . cholecalciferol (VITAMIN D) 1000 UNITS tablet Take 1,000 Units by mouth daily.    . Multiple Vitamins-Minerals (MULTIVITAMIN ADULT PO) Take 1 tablet by mouth daily. Pt takes One-A-Day brand    . omeprazole (PRILOSEC) 40 MG capsule Take 40 mg by mouth daily.     . valACYclovir (VALTREX) 1000 MG tablet Take 1 tablet (1,000 mg total) by mouth daily. 3Scott  tablet 5  . [DISCONTINUED] ranitidine (ZANTAC) 150 MG capsule Take 1 capsule (150 mg total) by mouth daily. 30 capsule 0   No current facility-administered medications on file prior to visit.    Allergies:  Allergies  Allergen Reactions  . Amoxicillin-Pot Clavulanate Rash  . Famvir [Famciclovir] Photosensitivity  . Penicillins Rash    Right forearm erythema on volar aspect only    Family History: Family History  Problem Relation Age of Onset  . Hypertension Mother   . Heart disease Father     smoker  . Dementia Maternal Grandmother   . Heart disease Maternal Grandmother     s/p MI  .  COPD Maternal Grandfather     smoker  . Cancer Maternal Grandfather     intestinal  . Colon cancer Neg Hx   . Colon polyps Neg Hx   . Diabetes Neg Hx     Social History: History   Social History  . Marital Status: Married    Spouse Name: N/A  . Number of Children: 4  . Years of Education: N/A   Occupational History  . 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 Smoking status: Never Smoker   . Smokeless tobacco: Never Used  . Alcohol Use: No  . Drug Use: No  . Sexual Activity:    Partners: Male   Other Topics Concern  . Not on file   Social History Narrative   Lives with husband and 4 children in a 3 story home.  Does not work.  Education: college    Review of Systems:  CONSTITUTIONAL: No fevers, chills, night sweats, or weight loss.   EYES: No visual changes or eye pain ENT: No hearing changes.  No history of nose bleeds.   RESPIRATORY: No cough, wheezing and shortness of breath.   CARDIOVASCULAR: Negative for chest pain, and palpitations.   GI: Negative for abdominal discomfort, blood in stools or black stools.  No recent change in bowel habits.   GU:  No history of incontinence.   MUSCLOSKELETAL: +history of joint pain or swelling.  No myalgias.   SKIN: Negative for lesions, rash, and itching.   HEMATOLOGY/ONCOLOGY: Negative for prolonged bleeding, bruising easily, and swollen nodes.  No history of cancer.   ENDOCRINE: Negative for cold or heat intolerance, polydipsia or goiter.   PSYCH:  No depression or anxiety symptoms.   NEURO: As Above.   Vital Signs:  BP 108/68 mmHg  Pulse 71  Ht 5' 1.5" (1.562 m)  Wt 150 lb 7 oz (68.238 kg)  BMI 27.97 kg/m2  SpO2 99%  LMP 01/31/2015   General Medical Exam:   General:  Well appearing, comfortable.   Eyes/ENT: see cranial nerve examination.   Neck: No masses appreciated.  Full range of motion without tenderness.  No carotid bruits. Respiratory:  Clear to auscultation, good air entry bilaterally.   Cardiac:   Regular rate and rhythm, no murmur.   Extremities:  No deformities, edema, or skin discoloration.  Skin:  No rashes or lesions.  Neurological Exam: MENTAL STATUS including orientation to time, place, person, recent and remote memory, attention span and concentration, language, and fund of knowledge is normal.  Speech is not dysarthric.  CRANIAL NERVES: II:  No visual field defects.  Unremarkable fundi.   III-IV-VI: Pupils equal round and reactive to light.  Normal conjugate, extra-ocular eye movements in all directions of gaze.  No nystagmus.  No ptosis.   V:  Normal facial sensation.  VII:  Normal facial symmetry and movements.  No pathologic facial reflexes.  VIII:  Normal hearing and vestibular function.   IX-X:  Normal palatal movement.   XI:  Normal shoulder shrug and head rotation.   XII:  Normal tongue strength and range of motion, no deviation or fasciculation.  MOTOR:  No atrophy, fasciculations or abnormal movements.  No pronator drift.  Tone is normal.  Straight leg raise negative.  Right Upper Extremity:    Left Upper Extremity:    Deltoid  5/5   Deltoid  5/5   Biceps  5/5   Biceps  5/5   Triceps  5/5   Triceps  5/5   Wrist extensors  5/5   Wrist extensors  5/5   Wrist flexors  5/5   Wrist flexors  5/5   Finger extensors  5/5   Finger extensors  5/5   Finger flexors  5/5   Finger flexors  5/5   Dorsal interossei  5/5   Dorsal interossei  5/5   Abductor pollicis  5/5   Abductor pollicis  5/5   Tone (Ashworth scale)  0  Tone (Ashworth scale)  0   Right Lower Extremity:    Left Lower Extremity:    Hip flexors  5/5   Hip flexors  5/5   Hip extensors  5/5   Hip extensors  5/5   Knee flexors  5/5   Knee flexors  5/5   Knee extensors  5/5   Knee extensors  5/5   Dorsiflexors  5/5   Dorsiflexors  5/5   Plantarflexors  5/5   Plantarflexors  5/5   Toe extensors  5/5   Toe extensors  5/5   Toe flexors  5/5   Toe flexors  5/5   Tone (Ashworth scale)  0  Tone (Ashworth scale)   0   MSRs:  Right                                                                 Left brachioradialis 2+  brachioradialis 2+  biceps 2+  biceps 2+  triceps 2+  triceps 2+  patellar 2+  patellar 2+  ankle jerk 2+  ankle jerk 2+  Hoffman no  Hoffman no  plantar response down  plantar response down   SENSORY:  Normal and symmetric perception of light touch, pinprick, vibration, and proprioception.    COORDINATION/GAIT: Normal finger-to- nose-finger and heel-to-shin.  Intact rapid alternating movements bilaterally.  Able to rise from a chair without using arms.  Gait narrow based and stable. Tandem and stressed gait intact.    IMPRESSION/PLAN: 1.  Lumbosacral radiculopathy (? right L5 nerve root, especially since she had worst of the paresthesias between the first web space).  She does endorse slow improvement of symptoms since starting massage therapy and using ibuprofen.  MRI lumbar spine is scheduled for Saturday.  Given that she is not yet back to her baseline, I would like to start physical therapy for low back pain.  2.  Right sided paresthesias of the arm and thigh, now resolved.  Normal exam makes central nervous system pathology (multiple sclerosis) less likely.  If symptoms worsen, can reassess.   Return to clinic as needed   The duration of this appointment  visit was 35 minutes of face-to-face time with the patient.  Greater than 50% of this time was spent in counseling, explanation of diagnosis, planning of further management, and coordination of care.   Thank you for allowing me to participate in patient's care.  If I can answer any additional questions, I would be pleased to do so.    Sincerely,    Donika K. Posey Pronto, DO

## 2015-02-28 ENCOUNTER — Ambulatory Visit (HOSPITAL_BASED_OUTPATIENT_CLINIC_OR_DEPARTMENT_OTHER)
Admission: RE | Admit: 2015-02-28 | Discharge: 2015-02-28 | Disposition: A | Discharge status: Home / Self Care | Payer: 59 | Source: Ambulatory Visit | Attending: Family Medicine | Admitting: Family Medicine

## 2015-02-28 DIAGNOSIS — M545 Low back pain, unspecified: Secondary | ICD-10-CM

## 2015-02-28 DIAGNOSIS — R350 Frequency of micturition: Secondary | ICD-10-CM

## 2015-02-28 DIAGNOSIS — R200 Anesthesia of skin: Secondary | ICD-10-CM | POA: Insufficient documentation

## 2015-03-01 ENCOUNTER — Encounter: Payer: Self-pay | Admitting: Family Medicine

## 2015-03-01 NOTE — Progress Notes (Signed)
[name]  [id number] Nov 25, 1970 03/01/2015      Progress Note-Follow Up  Subjective  Chief Complaint  Chief Complaint  Patient presents with  . Pain    HPI  Patient is a 44 y.o. female in today for routine medical care. Patient is in today with musculoskeletal complaints. She notes thoracic back pain as well as some lower back pain. She notes some tingling and mild weakness into the right arm. She denies any injury or falls. She's complaining of pain in her right hip and right thigh as well as burning all the way down into her toes. Has tried chiropractic and acupuncture with some relief. She notes decreased vibratory sensation on exam previously most notably in the right foot. All of the symptoms occurred when she carried a very heavy 44-year-old for a long [place] [place]. Her symptoms are presently improving but do still persist. No incontinence or new GI or GU complaints. Denies CP/palp/SOB/HA/congestion/fevers/GI or GU c/o. Taking meds as prescribed  Past Medical History  Diagnosis Date  . 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 Hand pain, left 01/03/2011  . Overweight(278.02) 01/03/2011  . Right foot pain 01/03/2011  . Tinnitus, left 01/03/2011  . Vaginitis 02/02/2011  . Allergic conjunctivitis 03/25/2011  . Paresthesias 09/18/2011  . Chest pain   . GERD (gastroesophageal reflux disease)     Past Surgical History  Procedure Laterality Date  . Cesarean section      X 4  . Inner ear surgery  1997  . Appendectomy  1997  . Tubal ligation      Family History  Problem Relation Age of Onset  . Hypertension Mother   . Heart disease Father     smoker  . Dementia Maternal Grandmother   . Heart disease Maternal Grandmother     s/p MI  . COPD Maternal Grandfather     smoker  . Cancer Maternal Grandfather     intestinal  . Colon cancer Neg Hx   . Colon polyps Neg Hx   . Diabetes Neg Hx     History   Social History  . Marital Status:  Married    Spouse Name: N/A  . Number of Children: 4  . Years of Education: N/A   Occupational History  . 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 Smoking status: Never Smoker   . Smokeless tobacco: Never Used  . Alcohol Use: No  . Drug Use: No  . Sexual Activity:    Partners: Male   Other Topics Concern  . Not on file   Social History Narrative   Lives with husband and 4 children in a 3 story home.  Does not work.  Education: college    Current Outpatient Prescriptions on File Prior to Visit  Medication Sig Dispense Refill  . Ascorbic Acid (VITAMIN C) 1000 MG tablet Take 1,000 mg by mouth daily.    . cetirizine (ZYRTEC) 10 MG tablet Take 10 mg by mouth daily.    . cholecalciferol (VITAMIN D) 1000 UNITS tablet Take 1,000 Units by mouth daily.    . Multiple Vitamins-Minerals (MULTIVITAMIN ADULT PO) Take 1 tablet by mouth daily. Pt takes One-A-Day brand    . omeprazole (PRILOSEC) 40 MG capsule Take 40 mg by mouth daily.     . [DISCONTINUED] ranitidine (ZANTAC) 150 MG capsule Take 1 capsule (150 mg total) by mouth daily. 30 capsule 0   No current facility-administered medications on file prior to  visit.    Allergies  Allergen Reactions  . Amoxicillin-Pot Clavulanate Rash  . Famvir [Famciclovir] Photosensitivity  . Penicillins Rash    Right forearm erythema on volar aspect only    Review of Systems  Review of Systems  Constitutional: Negative for fever and malaise/fatigue.  HENT: Negative for congestion.   Eyes: Negative for discharge.  Respiratory: Negative for shortness of breath.   Cardiovascular: Negative for chest pain, palpitations and leg swelling.  Gastrointestinal: Negative for nausea, abdominal pain and diarrhea.  Genitourinary: Negative for dysuria.  Musculoskeletal: Positive for back pain and joint pain. Negative for falls.  Skin: Negative for rash.  Neurological: Positive for tingling and sensory change. Negative for loss of consciousness and  headaches.  Endo/Heme/Allergies: Negative for polydipsia.  Psychiatric/Behavioral: Negative for depression and suicidal ideas. The patient is nervous/anxious. The patient does not have insomnia.     Objective  BP 108/64 mmHg  Pulse 91  Temp(Src) 97.9 F (36.6 C) (Oral)  Ht 5' 1.5" (1.562 m)  Wt 146 lb 6 oz (66.395 kg)  BMI 27.21 kg/m2  SpO2 98%  LMP 01/31/2015  Physical Exam  Physical Exam  Constitutional: She is oriented to person, place, and time and well-developed, well-nourished, and in no distress. No distress.  HENT:  Head: Normocephalic and atraumatic.  Eyes: Conjunctivae are normal.  Neck: Neck supple. No thyromegaly present.  Cardiovascular: Normal rate, regular rhythm and normal heart sounds.   No murmur heard. Pulmonary/Chest: Effort normal and breath sounds normal. She has no wheezes.  Abdominal: She exhibits no distension and no mass.  Musculoskeletal: She exhibits no edema.  Lymphadenopathy:    She has no cervical adenopathy.  Neurological: She is alert and oriented to person, place, and time.  Skin: Skin is warm and dry. No rash noted. She is not diaphoretic.  Psychiatric: Memory, affect and judgment normal.    Lab Results  Component Value Date   TSH 1.578 07/16/2013   Lab Results  Component Value Date   WBC 7.9 02/15/2015   HGB 11.9* 02/15/2015   HCT 36.0 02/15/2015   MCV 88.5 02/15/2015   PLT 196 02/15/2015   Lab Results  Component Value Date   CREATININE 0.71 02/15/2015   BUN 20 02/15/2015   NA 136 02/15/2015   K 4.3 02/15/2015   CL 103 02/15/2015   CO2 26 02/15/2015   Lab Results  Component Value Date   ALT 13 07/16/2013   AST 16 07/16/2013   ALKPHOS 62 07/16/2013   BILITOT 0.3 07/16/2013   Lab Results  Component Value Date   CHOL 193 07/16/2013   Lab Results  Component Value Date   HDL 49 07/16/2013   Lab Results  Component Value Date   LDLCALC 130* 07/16/2013   Lab Results  Component Value Date   TRIG 70 07/16/2013    Lab Results  Component Value Date   CHOLHDL 3.9 07/16/2013     Assessment & Plan  Musculoskeletal pain Complaining of back painaa most notably in thoracic spine, is also noting some tingling/pain in extremities and pain in right hip. Chiropractic care has been mildly helpful. She has decreased sensation in right foot.has been referred to neurology. MRI unremarkable except for a small hemangioma.   Overweight Encouraged DASH diet, decrease po intake and increase exercise as tolerated. Needs 7-8 hours of sleep nightly. Avoid trans fats, eat small, frequent meals every 4-5 hours with lean proteins, complex carbs and healthy fats. Minimize simple carbs

## 2015-03-01 NOTE — Assessment & Plan Note (Signed)
Encouraged DASH diet, decrease po intake and increase exercise as tolerated. Needs 7-8 hours of sleep nightly. Avoid trans fats, eat small, frequent meals every 4-5 hours with lean proteins, complex carbs and healthy fats. Minimize simple carbs 

## 2015-03-01 NOTE — Assessment & Plan Note (Signed)
Complaining of back painaa most notably in thoracic spine, is also noting some tingling/pain in extremities and pain in right hip. Chiropractic care has been mildly helpful. She has decreased sensation in right foot.has been referred to neurology. MRI unremarkable except for a small hemangioma.

## 2015-03-06 ENCOUNTER — Telehealth: Payer: Self-pay | Admitting: Family Medicine

## 2015-03-06 NOTE — Telephone Encounter (Signed)
Called the patient on her cell number [phone].

## 2015-03-06 NOTE — Telephone Encounter (Signed)
Relation to pt: self  Call back number: [phone]   Reason for call:  Patient inquiring about x ray results. Patient in need of clarification

## 2015-03-06 NOTE — Telephone Encounter (Signed)
Called left message to call back on cell/home number.

## 2015-03-09 NOTE — Telephone Encounter (Signed)
Called left message to call back 

## 2015-03-09 NOTE — Telephone Encounter (Signed)
Basically it is likely coming from a musculosketal back strain. I can refer to ortho for further evaluation and can check some labs again, repeat sed rate with lyme and RMSF for elevated sed rate and paresthesias

## 2015-03-09 NOTE — Telephone Encounter (Signed)
Patient returning your call.

## 2015-03-09 NOTE — Telephone Encounter (Signed)
Spoke to the patient regarding her MRI results on 02/28/15.  The patients pain is currently a little better, but the more she does the worse it gets.  She is currently on prednisone.  She would like to further evaluate then what else or next step to take to determine the pain and where it is coming from.

## 2015-03-10 ENCOUNTER — Other Ambulatory Visit: Payer: Self-pay | Admitting: Family Medicine

## 2015-03-10 DIAGNOSIS — M549 Dorsalgia, unspecified: Secondary | ICD-10-CM

## 2015-03-10 NOTE — Telephone Encounter (Signed)
Called left message to call back 

## 2015-03-10 NOTE — Telephone Encounter (Signed)
Called the patient and for now please refer to ortho.  She does not want to do labs at this time.  She is getting a massage and is helping, due to that thinks as PCP does it may just be musculosketal back strain.  Please refer to ortho.

## 2015-03-26 ENCOUNTER — Other Ambulatory Visit: Payer: Self-pay | Admitting: Obstetrics and Gynecology

## 2015-03-31 ENCOUNTER — Encounter: Payer: Self-pay | Admitting: Family Medicine

## 2015-04-01 ENCOUNTER — Telehealth: Payer: Self-pay | Admitting: Nurse Practitioner

## 2015-04-01 NOTE — Telephone Encounter (Signed)
Clydie Braun at Dr. Huel Coventry office calling to refer new patient for VIN 3; apt time/date 04/02/15 10:00 given per Warner Mccreedy, NP. Clydie Braun to inform patient and fax records to [phone].

## 2015-04-02 ENCOUNTER — Other Ambulatory Visit: Payer: Self-pay | Admitting: Family Medicine

## 2015-04-02 ENCOUNTER — Ambulatory Visit: Payer: 59 | Attending: Gynecologic Oncology | Admitting: Gynecologic Oncology

## 2015-04-02 ENCOUNTER — Encounter: Payer: Self-pay | Admitting: Gynecologic Oncology

## 2015-04-02 VITALS — BP 110/62 | HR 77 | Temp 98.0°F | Resp 18 | Ht 61.5 in | Wt 154.7 lb

## 2015-04-02 DIAGNOSIS — D071 Carcinoma in situ of vulva: Secondary | ICD-10-CM | POA: Diagnosis not present

## 2015-04-02 DIAGNOSIS — R5383 Other fatigue: Secondary | ICD-10-CM

## 2015-04-02 DIAGNOSIS — R202 Paresthesia of skin: Secondary | ICD-10-CM

## 2015-04-02 DIAGNOSIS — R635 Abnormal weight gain: Secondary | ICD-10-CM

## 2015-04-02 NOTE — Patient Instructions (Signed)
Plan for surgery with Dr. Laurette Schimke the morning of Sept 8.  You will receive a phone call from [facility] one to two days before your procedure.  We will schedule you for a wide local excision of the vulva.

## 2015-04-02 NOTE — Progress Notes (Signed)
Consult Note: Gyn-Onc  Consult was requested by Dr. Henderson Cloud for the evaluation of [name] 44 y.o. female  CC:  Chief Complaint  Patient presents with  . 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  Counseled regarding WLE and the  risks and benefits of the procedure.  Will perform WLE 04/30/2015   HPI: [name] is a 44 y.o.  G6P4 who presented with the c/o vulva discomfort.  Evaluated by Dr. Henderson Cloud.  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  Recent pap wnl.  HIV test a few months ago is negative.  No prior h/o genital dysplasia`   Review of Systems:  Constitutional  Feels well,  Cardiovascular  No chest pain, shortness of breath, or edema  Pulmonary  No cough or wheeze.  Gastro Intestinal  No nausea, vomitting, or diarrhoea. No bright red blood per rectum, no abdominal pain, change in bowel movement, or constipation.  Genito Urinary  No frequency, urgency, dysuria, discomfort at bx site Musculo Skeletal  No myalgia, arthralgia, joint swelling or pain  Neurologic  No weakness, numbness, change in gait,  Psychology  No depression, anxiety, insomnia.    Current Meds:  Outpatient Encounter Prescriptions as of 04/02/2015  Medication Sig  . Ascorbic Acid (VITAMIN C) 1000 MG tablet Take 1,000 mg by mouth daily.  . cetirizine (ZYRTEC) 10 MG tablet Take 10 mg by mouth daily.  . cholecalciferol (VITAMIN D) 1000 UNITS tablet Take 1,000 Units by mouth daily.  . ferrous fumarate (HEMOCYTE - 106 MG FE) 325 (106 FE) MG TABS tablet Take 1 tablet by mouth daily.  . magnesium 30 MG tablet Take 30 mg by mouth daily.  . Multiple Vitamins-Minerals (MULTIVITAMIN ADULT PO) Take 1 tablet by mouth daily. Pt takes One-A-Day brand  . omeprazole (PRILOSEC) 40 MG capsule Take 40 mg by mouth daily.   . valACYclovir (VALTREX) 1000 MG tablet Take 1 tablet (1,000 mg total) by mouth daily.  . [DISCONTINUED]  methylPREDNISolone (MEDROL DOSEPAK) 4 MG TBPK tablet    No facility-administered encounter medications on file as of 04/02/2015.    Allergy:  Allergies  Allergen Reactions  . Amoxicillin-Pot Clavulanate Rash  . Famvir [Famciclovir] Photosensitivity  . Penicillins Rash    Right forearm erythema on volar aspect only    Social Hx:   Social History   Social History  . Marital Status: Married    Spouse Name: N/A  . Number of Children: 4  . Years of Education: N/A   Occupational History  . 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 Smoking status: Never Smoker   . Smokeless tobacco: Never Used  . Alcohol Use: No  . Drug Use: No  . Sexual Activity:    Partners: Male   Other Topics Concern  . Not on file   Social History Narrative   Lives with husband and 4 children in a 3 story home.  Does not work.  Education: college    Past Surgical Hx:  Past Surgical History  Procedure Laterality Date  . Cesarean section      X 4  . Inner ear surgery  1997  . Appendectomy  1997  . Tubal ligation      Past Medical Hx:  Past Medical History  Diagnosis Date  . 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 Hand pain, left 01/03/2011  . Overweight(278.02) 01/03/2011  .  Right foot pain 01/03/2011  . Tinnitus, left 01/03/2011  . Vaginitis 02/02/2011  . Allergic conjunctivitis 03/25/2011  . Paresthesias 09/18/2011  . Chest pain   . GERD (gastroesophageal reflux disease)     Past Gynecological History:  G6P4 LMP 03/27/2015 h/o condyloma x 3 years. HIV neg NSVD x4  HSV+  Family Hx:  Family History  Problem Relation Age of Onset  . Hypertension Mother   . Heart disease Father     smoker  . Dementia Maternal Grandmother   . Heart disease Maternal Grandmother     s/p MI  . COPD Maternal Grandfather     smoker  . Cancer Maternal Grandfather     intestinal  . Colon cancer Neg Hx   . Colon polyps Neg Hx   . Diabetes Neg Hx     Vitals:  Blood pressure 110/62, pulse 77,  temperature 98 F (36.7 C), temperature source Oral, resp. rate 18, height 5' 1.5" (1.562 m), weight 154 lb 11.2 oz (70.171 kg), SpO2 100 %.  Physical Exam: WD in NAD Neck  Supple NROM, without any enlargements.  Lymph Node Survey No cervical supraclavicular or inguinal adenopathy Cardiovascular  Pulse normal rate, regularity and rhythm.   Lungs  Clear to auscultation bilaterally,  Good air movement.  Skin  No rash/lesions/breakdown  Psychiatry  Alert and oriented appropriate mood affect speech and reasoning. Abdomen  Normoactive bowel sounds, abdomen soft, non-tender.  Back No CVA tenderness Genito Urinary  Vulva/vagina: Normal external female genitalia.  1x2cm acetowhite lesion on the left perineum  Extremities  No bilateral cyanosis, clubbing or edema.   Laurette Schimke, MD, PhD 04/02/2015, 5:45 PM

## 2015-04-03 ENCOUNTER — Other Ambulatory Visit: Payer: 59

## 2015-04-03 ENCOUNTER — Other Ambulatory Visit (INDEPENDENT_AMBULATORY_CARE_PROVIDER_SITE_OTHER): Payer: 59

## 2015-04-03 DIAGNOSIS — R202 Paresthesia of skin: Secondary | ICD-10-CM | POA: Diagnosis not present

## 2015-04-03 DIAGNOSIS — R635 Abnormal weight gain: Secondary | ICD-10-CM | POA: Diagnosis not present

## 2015-04-03 DIAGNOSIS — R5383 Other fatigue: Secondary | ICD-10-CM

## 2015-04-03 LAB — TSH: TSH: 3.12 u[IU]/mL (ref 0.35–4.50)

## 2015-04-03 LAB — T4, FREE: Free T4: 0.69 ng/dL (ref 0.60–1.60)

## 2015-04-07 ENCOUNTER — Other Ambulatory Visit (HOSPITAL_COMMUNITY)
Admission: RE | Admit: 2015-04-07 | Discharge: 2015-04-07 | Disposition: A | Discharge status: Home / Self Care | Payer: 59 | Source: Ambulatory Visit | Attending: Family Medicine | Admitting: Family Medicine

## 2015-04-07 ENCOUNTER — Telehealth: Payer: Self-pay | Admitting: Gynecologic Oncology

## 2015-04-07 ENCOUNTER — Ambulatory Visit (INDEPENDENT_AMBULATORY_CARE_PROVIDER_SITE_OTHER): Payer: 59 | Admitting: Family Medicine

## 2015-04-07 ENCOUNTER — Encounter: Payer: Self-pay | Admitting: Family Medicine

## 2015-04-07 DIAGNOSIS — N760 Acute vaginitis: Secondary | ICD-10-CM | POA: Diagnosis not present

## 2015-04-07 DIAGNOSIS — L299 Pruritus, unspecified: Secondary | ICD-10-CM | POA: Diagnosis not present

## 2015-04-07 DIAGNOSIS — F419 Anxiety disorder, unspecified: Secondary | ICD-10-CM | POA: Insufficient documentation

## 2015-04-07 DIAGNOSIS — D071 Carcinoma in situ of vulva: Secondary | ICD-10-CM | POA: Diagnosis not present

## 2015-04-07 DIAGNOSIS — F32A Depression, unspecified: Secondary | ICD-10-CM

## 2015-04-07 DIAGNOSIS — F329 Major depressive disorder, single episode, unspecified: Secondary | ICD-10-CM | POA: Insufficient documentation

## 2015-04-07 HISTORY — DX: Anxiety disorder, unspecified: F41.9

## 2015-04-07 HISTORY — DX: Depression, unspecified: F32.A

## 2015-04-07 HISTORY — DX: Pruritus, unspecified: L29.9

## 2015-04-07 MED ORDER — ALPRAZOLAM 0.25 MG PO TABS: 0.2500 mg | ORAL_TABLET | Freq: Every evening | ORAL | Status: DC | PRN

## 2015-04-07 MED ORDER — VALACYCLOVIR HCL 1 G PO TABS: 1000.0000 mg | ORAL_TABLET | Freq: Three times a day (TID) | ORAL | Status: DC

## 2015-04-07 MED ORDER — ESCITALOPRAM OXALATE 10 MG PO TABS: 10.0000 mg | ORAL_TABLET | Freq: Every day | ORAL | Status: DC

## 2015-04-07 MED ORDER — NYSTATIN 100000 UNIT/GM EX CREA: 1.0000 "application " | TOPICAL_CREAM | Freq: Two times a day (BID) | CUTANEOUS | Status: DC

## 2015-04-07 NOTE — Assessment & Plan Note (Signed)
Started on Escitalopram 10 mg po daily  And Alprazolam prn

## 2015-04-07 NOTE — Patient Instructions (Addendum)
[employer] can be found online at [email or web address]  Hypothyroidism The thyroid is a large gland located in the lower front of your neck. The thyroid gland helps control metabolism. Metabolism is how your body handles food. It controls metabolism with the hormone thyroxine. When this gland is underactive (hypothyroid), it produces too little hormone.  CAUSES These include:   Absence or destruction of thyroid tissue.  Goiter due to iodine deficiency.  Goiter due to medications.  Congenital defects (since birth).  Problems with the pituitary. This causes a lack of TSH (thyroid stimulating hormone). This hormone tells the thyroid to turn out more hormone. SYMPTOMS  Lethargy (feeling as though you have no energy)  Cold intolerance  Weight gain (in spite of normal food intake)  Dry skin  Coarse hair  Menstrual irregularity (if severe, may lead to infertility)  Slowing of thought processes Cardiac problems are also caused by insufficient amounts of thyroid hormone. Hypothyroidism in the newborn is cretinism, and is an extreme form. It is important that this form be treated adequately and immediately or it will lead rapidly to retarded physical and mental development. DIAGNOSIS  To prove hypothyroidism, your caregiver may do blood tests and ultrasound tests. Sometimes the signs are hidden. It may be necessary for your caregiver to watch this illness with blood tests either before or after diagnosis and treatment. TREATMENT  Low levels of thyroid hormone are increased by using synthetic thyroid hormone. This is a safe, effective treatment. It usually takes about four weeks to gain the full effects of the medication. After you have the full effect of the medication, it will generally take another four weeks for problems to leave. Your caregiver may start you on low doses. If you have had heart problems the dose may be gradually increased. It is generally not an emergency to get  rapidly to normal. HOME CARE INSTRUCTIONS   Take your medications as your caregiver suggests. Let your caregiver know of any medications you are taking or start taking. Your caregiver will help you with dosage schedules.  As your condition improves, your dosage needs may increase. It will be necessary to have continuing blood tests as suggested by your caregiver.  Report all suspected medication side effects to your caregiver. SEEK MEDICAL CARE IF: Seek medical care if you develop:  Sweating.  Tremulousness (tremors).  Anxiety.  Rapid weight loss.  Heat intolerance.  Emotional swings.  Diarrhea.  Weakness. SEEK IMMEDIATE MEDICAL CARE IF:  You develop chest pain, an irregular heart beat (palpitations), or a rapid heart beat. MAKE SURE YOU:   Understand these instructions.  Will watch your condition.  Will get help right away if you are not doing well or get worse. Document Released: 08/08/2005 Document Revised: 10/31/2011 Document Reviewed: 03/28/2008 [facility] Patient Information 2015 [place], [place]. This information is not intended to replace advice given to you by your health care provider. Make sure you discuss any questions you have with your health care provider.

## 2015-04-07 NOTE — Progress Notes (Signed)
Pre visit review using our clinic review tool, if applicable. No additional management support is needed unless otherwise documented below in the visit note. 

## 2015-04-07 NOTE — Progress Notes (Signed)
Patient ID: [name], female   DOB: Jun 04, 1971, 44 y.o.   MRN: [id number]   Subjective:    Patient ID: [name], female    DOB: [birth year], 44 y.o.   MRN: [id number]  Chief Complaint  Patient presents with  . Thyroid Problem    HPI Patient is in today for follow up on numerous concerns. 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 No recent other illness but she is very stressed and tearful. Denies CP/palp/SOB/HA/congestion/fevers/GI or GU c/o. Taking meds as prescribed  Past Medical History  Diagnosis Date  . 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 Hand pain, left 01/03/2011  . Overweight(278.02) 01/03/2011  . Right foot pain 01/03/2011  . Tinnitus, left 01/03/2011  . Vaginitis 02/02/2011  . Allergic conjunctivitis 03/25/2011  . Paresthesias 09/18/2011  . Chest pain   . GERD (gastroesophageal reflux disease)     Past Surgical History  Procedure Laterality Date  . Cesarean section      X 4  . Inner ear surgery  1997  . Appendectomy  1997  . Tubal ligation      Family History  Problem Relation Age of Onset  . Hypertension Mother   . Heart disease Father     smoker  . Dementia Maternal Grandmother   . Heart disease Maternal Grandmother     s/p MI  . COPD Maternal Grandfather     smoker  . Cancer Maternal Grandfather     intestinal  . Colon cancer Neg Hx   . Colon polyps Neg Hx   . Diabetes Neg Hx     Social History   Social History  . Marital Status: Married    Spouse Name: N/A  . Number of Children: 4  . Years of Education: N/A   Occupational History  . 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 Smoking status: Never Smoker   . Smokeless tobacco: Never Used  . Alcohol Use: No  . Drug Use: No  . Sexual Activity:    Partners: Male   Other Topics Concern  . Not on file   Social History Narrative   Lives with husband and 4 children in a 3 story home.  Does not work.  Education:  college    Outpatient Prescriptions Prior to Visit  Medication Sig Dispense Refill  . Ascorbic Acid (VITAMIN C) 1000 MG tablet Take 1,000 mg by mouth daily.    . cetirizine (ZYRTEC) 10 MG tablet Take 10 mg by mouth daily.    . cholecalciferol (VITAMIN D) 1000 UNITS tablet Take 1,000 Units by mouth daily.    . ferrous fumarate (HEMOCYTE - 106 MG FE) 325 (106 FE) MG TABS tablet Take 1 tablet by mouth daily.    . magnesium 30 MG tablet Take 30 mg by mouth daily.    . Multiple Vitamins-Minerals (MULTIVITAMIN ADULT PO) Take 1 tablet by mouth daily. Pt takes One-A-Day brand    . omeprazole (PRILOSEC) 40 MG capsule Take 40 mg by mouth daily.     . valACYclovir (VALTREX) 1000 MG tablet Take 1 tablet (1,000 mg total) by mouth daily. 30 tablet 5   No facility-administered medications prior to visit.    Allergies  Allergen Reactions  . Amoxicillin-Pot Clavulanate Rash  . Famvir [Famciclovir] Photosensitivity  . Penicillins Rash    Right forearm erythema on volar aspect only    Review of Systems  Constitutional: Positive for malaise/fatigue. Negative  for fever.  HENT: Negative for congestion.   Eyes: Negative for discharge.  Respiratory: Negative for shortness of breath.   Cardiovascular: Negative for chest pain, palpitations and leg swelling.  Gastrointestinal: Negative for nausea and abdominal pain.  Genitourinary: Negative for dysuria.  Musculoskeletal: Negative for falls.  Skin: Negative for itching and rash.  Neurological: Negative for loss of consciousness and headaches.  Endo/Heme/Allergies: Negative for environmental allergies.  Psychiatric/Behavioral: Positive for depression. The patient is nervous/anxious.        Objective:    Physical Exam  Constitutional: She is oriented to person, place, and time. She appears well-developed and well-nourished. No distress.  HENT:  Head: Normocephalic and atraumatic.  Nose: Nose normal.  Eyes: Right eye exhibits no discharge. Left eye  exhibits no discharge.  Neck: Normal range of motion. Neck supple.  Cardiovascular: Normal rate and regular rhythm.   No murmur heard. Pulmonary/Chest: Effort normal and breath sounds normal.  Abdominal: Soft. Bowel sounds are normal. There is no tenderness.  Musculoskeletal: She exhibits no edema.  Neurological: She is alert and oriented to person, place, and time.  Skin: Skin is warm and dry.  Psychiatric: She has a normal mood and affect.  Nursing note and vitals reviewed.   BP 101/64 mmHg  Pulse 67  Temp(Src) 98.4 F (36.9 C) (Oral)  Ht 5' 1.5" (1.562 m)  Wt 157 lb (71.215 kg)  BMI 29.19 kg/m2  SpO2 100%  LMP 03/27/2015 Wt Readings from Last 3 Encounters:  04/07/15 157 lb (71.215 kg)  04/02/15 154 lb 11.2 oz (70.171 kg)  02/26/15 150 lb 7 oz (68.238 kg)     Lab Results  Component Value Date   WBC 7.9 02/15/2015   HGB 11.9* 02/15/2015   HCT 36.0 02/15/2015   PLT 196 02/15/2015   GLUCOSE 95 02/15/2015   CHOL 193 07/16/2013   TRIG 70 07/16/2013   HDL 49 07/16/2013   LDLCALC 130* 07/16/2013   ALT 13 07/16/2013   AST 16 07/16/2013   NA 136 02/15/2015   K 4.3 02/15/2015   CL 103 02/15/2015   CREATININE 0.71 02/15/2015   BUN 20 02/15/2015   CO2 26 02/15/2015   TSH 3.12 04/03/2015    Lab Results  Component Value Date   TSH 3.12 04/03/2015   Lab Results  Component Value Date   WBC 7.9 02/15/2015   HGB 11.9* 02/15/2015   HCT 36.0 02/15/2015   MCV 88.5 02/15/2015   PLT 196 02/15/2015   Lab Results  Component Value Date   NA 136 02/15/2015   K 4.3 02/15/2015   CO2 26 02/15/2015   GLUCOSE 95 02/15/2015   BUN 20 02/15/2015   CREATININE 0.71 02/15/2015   BILITOT 0.3 07/16/2013   ALKPHOS 62 07/16/2013   AST 16 07/16/2013   ALT 13 07/16/2013   PROT 6.9 07/16/2013   ALBUMIN 4.3 07/16/2013   ALBUMIN 4.3 07/16/2013   CALCIUM 8.9 02/15/2015   ANIONGAP 7 02/15/2015   GFR 88.48 01/03/2011   Lab Results  Component Value Date   CHOL 193 07/16/2013    Lab Results  Component Value Date   HDL 49 07/16/2013   Lab Results  Component Value Date   LDLCALC 130* 07/16/2013   Lab Results  Component Value Date   TRIG 70 07/16/2013   Lab Results  Component Value Date   CHOLHDL 3.9 07/16/2013   No results found for: HGBA1C     Assessment & Plan:   VIN III (vulvar intraepithelial neoplasia III)  Dr Laurette Schimke or Dr Andrey Farmer will do surgery on either 04/24/2015 or 04/30/2015, referred to them by Dr Huntley Dec, patient very stressed  Overweight Encouraged DASH diet, decrease po intake and increase exercise as tolerated. Needs 7-8 hours of sleep nightly. Avoid trans fats, eat small, frequent meals every 4-5 hours with lean proteins, complex carbs and healthy fats. Minimize simple carbs  Anxiety and depression Started on Escitalopram 10 mg po daily  And Alprazolam prn   I am having Ms. Sirmon maintain her omeprazole, cholecalciferol, vitamin C, Multiple Vitamins-Minerals (MULTIVITAMIN ADULT PO), cetirizine, valACYclovir, ferrous fumarate, and magnesium.  No orders of the defined types were placed in this encounter.     Baird Kay, LPN

## 2015-04-07 NOTE — Assessment & Plan Note (Signed)
Dr Laurette Schimke or Dr Andrey Farmer will do surgery on either 04/24/2015 or 04/30/2015, referred to them by Dr Huntley Dec, patient very stressed

## 2015-04-07 NOTE — Assessment & Plan Note (Signed)
Encouraged DASH diet, decrease po intake and increase exercise as tolerated. Needs 7-8 hours of sleep nightly. Avoid trans fats, eat small, frequent meals every 4-5 hours with lean proteins, complex carbs and healthy fats. Minimize simple carbs 

## 2015-04-07 NOTE — Telephone Encounter (Signed)
Returned call to patient.  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  OP surgery moved to 8/30 at 9:45am.  Pt verbalizing understanding.  Advised to call for any questions or concerns.

## 2015-04-10 LAB — URINE CYTOLOGY ANCILLARY ONLY
Bacterial vaginitis: NEGATIVE
Candida vaginitis: NEGATIVE

## 2015-04-14 ENCOUNTER — Encounter (HOSPITAL_BASED_OUTPATIENT_CLINIC_OR_DEPARTMENT_OTHER): Payer: Self-pay | Admitting: *Deleted

## 2015-04-14 NOTE — Progress Notes (Signed)
NPO AFTER MN.  ARRIVE AT 1610.  NEEDS HG .  WILL TAKE ZYRTEC, PRILOSEC, LEXAPRO, AND VALTREX AM DOS W/ SIPS OF WATER.

## 2015-04-21 ENCOUNTER — Ambulatory Visit (HOSPITAL_BASED_OUTPATIENT_CLINIC_OR_DEPARTMENT_OTHER): Payer: 59 | Admitting: Anesthesiology

## 2015-04-21 ENCOUNTER — Encounter (HOSPITAL_BASED_OUTPATIENT_CLINIC_OR_DEPARTMENT_OTHER)
Admission: RE | Disposition: A | Discharge status: Home / Self Care | Payer: Self-pay | Source: Ambulatory Visit | Attending: Gynecologic Oncology

## 2015-04-21 ENCOUNTER — Ambulatory Visit (HOSPITAL_BASED_OUTPATIENT_CLINIC_OR_DEPARTMENT_OTHER)
Admission: RE | Admit: 2015-04-21 | Discharge: 2015-04-21 | Disposition: A | Discharge status: Home / Self Care | Payer: 59 | Source: Ambulatory Visit | Attending: Gynecologic Oncology | Admitting: Gynecologic Oncology

## 2015-04-21 ENCOUNTER — Encounter (HOSPITAL_BASED_OUTPATIENT_CLINIC_OR_DEPARTMENT_OTHER): Payer: Self-pay | Admitting: Anesthesiology

## 2015-04-21 DIAGNOSIS — A6000 Herpesviral infection of urogenital system, unspecified: Secondary | ICD-10-CM | POA: Insufficient documentation

## 2015-04-21 DIAGNOSIS — Z79899 Other long term (current) drug therapy: Secondary | ICD-10-CM | POA: Insufficient documentation

## 2015-04-21 DIAGNOSIS — D071 Carcinoma in situ of vulva: Secondary | ICD-10-CM | POA: Diagnosis present

## 2015-04-21 DIAGNOSIS — K219 Gastro-esophageal reflux disease without esophagitis: Secondary | ICD-10-CM | POA: Insufficient documentation

## 2015-04-21 HISTORY — DX: Herpesviral infection of urogenital system, unspecified: A60.00

## 2015-04-21 HISTORY — DX: Carcinoma in situ of vulva: D07.1

## 2015-04-21 HISTORY — PX: VULVECTOMY: SHX1086

## 2015-04-21 SURGERY — WIDE EXCISION VULVECTOMY
Anesthesia: General

## 2015-04-21 MED ORDER — KETOROLAC TROMETHAMINE 30 MG/ML IJ SOLN
INTRAMUSCULAR | Status: DC | PRN
  Administered 2015-04-21: 30 mg via INTRAVENOUS

## 2015-04-21 MED ORDER — FENTANYL CITRATE (PF) 100 MCG/2ML IJ SOLN
INTRAMUSCULAR | Status: AC
  Filled 2015-04-21: qty 2

## 2015-04-21 MED ORDER — ONDANSETRON HCL 4 MG/2ML IJ SOLN
INTRAMUSCULAR | Status: DC | PRN
Start: 2015-04-21 — End: 2015-04-21
  Administered 2015-04-21: 4 mg via INTRAVENOUS

## 2015-04-21 MED ORDER — LIDOCAINE HCL 1 % IJ SOLN
INTRAMUSCULAR | Status: DC | PRN
  Administered 2015-04-21: 10 mL

## 2015-04-21 MED ORDER — ACETIC ACID 5 % SOLN
Status: DC | PRN
  Administered 2015-04-21: 1 via TOPICAL

## 2015-04-21 MED ORDER — PROMETHAZINE HCL 25 MG/ML IJ SOLN
6.2500 mg | INTRAMUSCULAR | Status: DC | PRN
  Filled 2015-04-21: qty 1

## 2015-04-21 MED ORDER — PROPOFOL 10 MG/ML IV BOLUS
INTRAVENOUS | Status: DC | PRN
  Administered 2015-04-21: 200 mg via INTRAVENOUS

## 2015-04-21 MED ORDER — FENTANYL CITRATE (PF) 100 MCG/2ML IJ SOLN
25.0000 ug | INTRAMUSCULAR | Status: DC | PRN
  Administered 2015-04-21: 25 ug via INTRAVENOUS
  Filled 2015-04-21: qty 1

## 2015-04-21 MED ORDER — LIDOCAINE HCL (CARDIAC) 20 MG/ML IV SOLN
INTRAVENOUS | Status: DC | PRN
  Administered 2015-04-21: 60 mg via INTRAVENOUS

## 2015-04-21 MED ORDER — DOCUSATE SODIUM 100 MG PO CAPS: 100.0000 mg | ORAL_CAPSULE | Freq: Two times a day (BID) | ORAL | Status: DC

## 2015-04-21 MED ORDER — OXYCODONE-ACETAMINOPHEN 5-325 MG PO TABS
ORAL_TABLET | ORAL | Status: AC
  Filled 2015-04-21: qty 1

## 2015-04-21 MED ORDER — OXYCODONE-ACETAMINOPHEN 5-325 MG PO TABS
1.0000 | ORAL_TABLET | ORAL | Status: AC | PRN
  Administered 2015-04-21: 1 via ORAL
  Filled 2015-04-21: qty 2

## 2015-04-21 MED ORDER — FENTANYL CITRATE (PF) 100 MCG/2ML IJ SOLN
INTRAMUSCULAR | Status: DC | PRN
  Administered 2015-04-21: 50 ug via INTRAVENOUS

## 2015-04-21 MED ORDER — OXYCODONE-ACETAMINOPHEN 10-325 MG PO TABS: 1.0000 | ORAL_TABLET | ORAL | Status: DC | PRN

## 2015-04-21 MED ORDER — MIDAZOLAM HCL 2 MG/2ML IJ SOLN
INTRAMUSCULAR | Status: AC
  Filled 2015-04-21: qty 2

## 2015-04-21 MED ORDER — LACTATED RINGERS IV SOLN
INTRAVENOUS | Status: DC
  Administered 2015-04-21 (×2): via INTRAVENOUS
  Filled 2015-04-21: qty 1000

## 2015-04-21 MED ORDER — MIDAZOLAM HCL 5 MG/5ML IJ SOLN
INTRAMUSCULAR | Status: DC | PRN
  Administered 2015-04-21: 2 mg via INTRAVENOUS

## 2015-04-21 MED ORDER — DEXAMETHASONE SODIUM PHOSPHATE 4 MG/ML IJ SOLN
INTRAMUSCULAR | Status: DC | PRN
  Administered 2015-04-21: 10 mg via INTRAVENOUS

## 2015-04-21 SURGICAL SUPPLY — 49 items
APPLICATOR COTTON TIP 6IN STRL (MISCELLANEOUS) IMPLANT
BLADE CLIPPER SURG (BLADE) IMPLANT
BLADE SURG 15 STRL LF DISP TIS (BLADE) ×1 IMPLANT
BLADE SURG 15 STRL SS (BLADE) ×1
BNDG GAUZE ELAST 4 BULKY (GAUZE/BANDAGES/DRESSINGS) ×2 IMPLANT
BRIEF STRETCH FOR OB PAD LRG (UNDERPADS AND DIAPERS) ×2 IMPLANT
CANISTER SUCTION 2500CC (MISCELLANEOUS) ×2 IMPLANT
CATH FOLEY 2WAY SLVR  5CC 14FR (CATHETERS)
CATH FOLEY 2WAY SLVR 5CC 14FR (CATHETERS) IMPLANT
CATH ROBINSON RED A/P 14FR (CATHETERS) ×2 IMPLANT
COVER BACK TABLE 60X90IN (DRAPES) ×2 IMPLANT
DRAPE LG THREE QUARTER DISP (DRAPES) ×2 IMPLANT
DRAPE UNDERBUTTOCKS STRL (DRAPE) ×2 IMPLANT
DRSG KUZMA FLUFF (GAUZE/BANDAGES/DRESSINGS) IMPLANT
GAUZE SPONGE 4X4 12PLY STRL (GAUZE/BANDAGES/DRESSINGS) IMPLANT
GAUZE SPONGE 4X4 16PLY XRAY LF (GAUZE/BANDAGES/DRESSINGS) ×2 IMPLANT
GLOVE BIO SURGEON STRL SZ 6 (GLOVE) ×4 IMPLANT
GLOVE BIO SURGEON STRL SZ7.5 (GLOVE) ×4 IMPLANT
GLOVE BIOGEL PI IND STRL 7.5 (GLOVE) ×1 IMPLANT
GLOVE BIOGEL PI INDICATOR 7.5 (GLOVE) ×1
LEGGING LITHOTOMY PAIR STRL (DRAPES) ×2 IMPLANT
MANIFOLD NEPTUNE II (INSTRUMENTS) IMPLANT
NEEDLE HYPO 22GX1.5 SAFETY (NEEDLE) IMPLANT
NEEDLE HYPO 25X1 1.5 SAFETY (NEEDLE) ×2 IMPLANT
NS IRRIG 500ML POUR BTL (IV SOLUTION) ×2 IMPLANT
PACK BASIN DAY SURGERY FS (CUSTOM PROCEDURE TRAY) ×2 IMPLANT
PAD OB MATERNITY 4.3X12.25 (PERSONAL CARE ITEMS) ×2 IMPLANT
PENCIL BUTTON HOLSTER BLD 10FT (ELECTRODE) ×2 IMPLANT
SCOPETTES 8  STERILE (MISCELLANEOUS)
SCOPETTES 8 STERILE (MISCELLANEOUS) IMPLANT
SUT VIC AB 0 SH 27 (SUTURE) ×2 IMPLANT
SUT VIC AB 2-0 CT2 27 (SUTURE) IMPLANT
SUT VIC AB 2-0 SH 27 (SUTURE)
SUT VIC AB 2-0 SH 27X BRD (SUTURE) IMPLANT
SUT VIC AB 3-0 PS2 18 (SUTURE)
SUT VIC AB 3-0 PS2 18XBRD (SUTURE) IMPLANT
SUT VIC AB 3-0 SH 27 (SUTURE) ×3
SUT VIC AB 3-0 SH 27X BRD (SUTURE) ×3 IMPLANT
SUT VICRYL 2 0 18  UND BR (SUTURE)
SUT VICRYL 2 0 18 UND BR (SUTURE) IMPLANT
SUT VICRYL 4-0 PS2 18IN ABS (SUTURE) ×8 IMPLANT
SYR BULB IRRIGATION 50ML (SYRINGE) ×2 IMPLANT
SYRINGE CONTROL L 12CC (SYRINGE) ×2 IMPLANT
TOWEL OR 17X24 6PK STRL BLUE (TOWEL DISPOSABLE) ×2 IMPLANT
TRAY DSU PREP LF (CUSTOM PROCEDURE TRAY) ×2 IMPLANT
TUBE CONNECTING 12X1/4 (SUCTIONS) ×2 IMPLANT
UNDERPAD 30X30 INCONTINENT (UNDERPADS AND DIAPERS) ×2 IMPLANT
WATER STERILE IRR 500ML POUR (IV SOLUTION) IMPLANT
YANKAUER SUCT BULB TIP NO VENT (SUCTIONS) ×2 IMPLANT

## 2015-04-21 NOTE — H&P (View-Only) (Signed)
Consult Note: Gyn-Onc  Consult was requested by Dr. Henderson Cloud for the evaluation of [name] 44 y.o. female  CC:  Chief Complaint  Patient presents with  . VIN III    Assessment/Plan:  [name] is a 44 y.o.  With VIN3 of the perineal body.  Counseled regarding WLE and the  risks and benefits of the procedure.  Will perform WLE 04/30/2015   HPI: [name] is a 44 y.o.  G6P4 who presented with the c/o vulva discomfort.  Evaluated by Dr. Henderson Cloud.  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  Recent pap wnl.  HIV test a few months ago is negative.  No prior h/o genital dysplasia`   Review of Systems:  Constitutional  Feels well,  Cardiovascular  No chest pain, shortness of breath, or edema  Pulmonary  No cough or wheeze.  Gastro Intestinal  No nausea, vomitting, or diarrhoea. No bright red blood per rectum, no abdominal pain, change in bowel movement, or constipation.  Genito Urinary  No frequency, urgency, dysuria, discomfort at bx site Musculo Skeletal  No myalgia, arthralgia, joint swelling or pain  Neurologic  No weakness, numbness, change in gait,  Psychology  No depression, anxiety, insomnia.    Current Meds:  Outpatient Encounter Prescriptions as of 04/02/2015  Medication Sig  . Ascorbic Acid (VITAMIN C) 1000 MG tablet Take 1,000 mg by mouth daily.  . cetirizine (ZYRTEC) 10 MG tablet Take 10 mg by mouth daily.  . cholecalciferol (VITAMIN D) 1000 UNITS tablet Take 1,000 Units by mouth daily.  . ferrous fumarate (HEMOCYTE - 106 MG FE) 325 (106 FE) MG TABS tablet Take 1 tablet by mouth daily.  . magnesium 30 MG tablet Take 30 mg by mouth daily.  . Multiple Vitamins-Minerals (MULTIVITAMIN ADULT PO) Take 1 tablet by mouth daily. Pt takes One-A-Day brand  . omeprazole (PRILOSEC) 40 MG capsule Take 40 mg by mouth daily.   . valACYclovir (VALTREX) 1000 MG tablet Take 1 tablet (1,000 mg total) by mouth daily.  . [DISCONTINUED]  methylPREDNISolone (MEDROL DOSEPAK) 4 MG TBPK tablet    No facility-administered encounter medications on file as of 04/02/2015.    Allergy:  Allergies  Allergen Reactions  . Amoxicillin-Pot Clavulanate Rash  . Famvir [Famciclovir] Photosensitivity  . Penicillins Rash    Right forearm erythema on volar aspect only    Social Hx:   Social History   Social History  . Marital Status: Married    Spouse Name: N/A  . Number of Children: 4  . Years of Education: N/A   Occupational History  . 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 Smoking status: Never Smoker   . Smokeless tobacco: Never Used  . Alcohol Use: No  . Drug Use: No  . Sexual Activity:    Partners: Male   Other Topics Concern  . Not on file   Social History Narrative   Lives with husband and 4 children in a 3 story home.  Does not work.  Education: college    Past Surgical Hx:  Past Surgical History  Procedure Laterality Date  . Cesarean section      X 4  . Inner ear surgery  1997  . Appendectomy  1997  . Tubal ligation      Past Medical Hx:  Past Medical History  Diagnosis Date  . 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 Hand pain, left 01/03/2011  . Overweight(278.02) 01/03/2011  .  Right foot pain 01/03/2011  . Tinnitus, left 01/03/2011  . Vaginitis 02/02/2011  . Allergic conjunctivitis 03/25/2011  . Paresthesias 09/18/2011  . Chest pain   . GERD (gastroesophageal reflux disease)     Past Gynecological History:  G6P4 LMP 03/27/2015 h/o condyloma x 3 years. HIV neg NSVD x4  HSV+  Family Hx:  Family History  Problem Relation Age of Onset  . Hypertension Mother   . Heart disease Father     smoker  . Dementia Maternal Grandmother   . Heart disease Maternal Grandmother     s/p MI  . COPD Maternal Grandfather     smoker  . Cancer Maternal Grandfather     intestinal  . Colon cancer Neg Hx   . Colon polyps Neg Hx   . Diabetes Neg Hx     Vitals:  Blood pressure 110/62, pulse 77,  temperature 98 F (36.7 C), temperature source Oral, resp. rate 18, height 5' 1.5" (1.562 m), weight 154 lb 11.2 oz (70.171 kg), SpO2 100 %.  Physical Exam: WD in NAD Neck  Supple NROM, without any enlargements.  Lymph Node Survey No cervical supraclavicular or inguinal adenopathy Cardiovascular  Pulse normal rate, regularity and rhythm.   Lungs  Clear to auscultation bilaterally,  Good air movement.  Skin  No rash/lesions/breakdown  Psychiatry  Alert and oriented appropriate mood affect speech and reasoning. Abdomen  Normoactive bowel sounds, abdomen soft, non-tender.  Back No CVA tenderness Genito Urinary  Vulva/vagina: Normal external female genitalia.  1x2cm acetowhite lesion on the left perineum  Extremities  No bilateral cyanosis, clubbing or edema.   Laurette Schimke, MD, PhD 04/02/2015, 5:45 PM

## 2015-04-21 NOTE — Anesthesia Procedure Notes (Signed)
Procedure Name: LMA Insertion Date/Time: 04/21/2015 9:53 AM Performed by: Tyrone Nine Pre-anesthesia Checklist: Patient identified, Timeout performed, Emergency Drugs available, Suction available and Patient being monitored Patient Re-evaluated:Patient Re-evaluated prior to inductionOxygen Delivery Method: Circle system utilized Preoxygenation: Pre-oxygenation with 100% oxygen Intubation Type: IV induction Ventilation: Mask ventilation without difficulty LMA: LMA inserted LMA Size: 4.0 Number of attempts: 1 Airway Equipment and Method: Bite block Placement Confirmation: positive ETCO2 and breath sounds checked- equal and bilateral Tube secured with: Tape Dental Injury: Teeth and Oropharynx as per pre-operative assessment

## 2015-04-21 NOTE — Interval H&P Note (Signed)
History and Physical Interval Note:  04/21/2015 9:35 AM  [name]  has presented today for surgery, with the diagnosis of vulvar dysplasia  The various methods of treatment have been discussed with the patient and family. After consideration of risks, benefits and other options for treatment, the patient has consented to  Procedure(s): WIDE EXCISION OF VULVAR (N/A) as a surgical intervention .  The patient's history has been reviewed, patient examined, no change in status, stable for surgery.  I have reviewed the patient's chart and labs.  Questions were answered to the patient's satisfaction.     Quinn Axe

## 2015-04-21 NOTE — Discharge Instructions (Signed)
Vulvectomy, Care After °The vulva is the external female genitalia, outside and around the vagina and pubic bone. It consists of: °· The skin on, and in front of, the pubic bone. °· The clitoris. °· The labia majora (large lips) on the outside of the vagina. °· The labia minora (small lips) around the opening of the vagina. °· The opening and the skin in and around the vagina. °A vulvectomy is the removal of the tissue of the vulva, which sometimes includes removal of the lymph nodes and tissue in the groin areas. °These discharge instructions provide you with general information on caring for yourself after you leave the hospital. It is also important that you know the warning signs of complications, so that you can seek treatment. Please read the instructions outlined below and refer to this sheet in the next few weeks. Your caregiver may also give you specific information and medicines. If you have any questions or complications after discharge, please call your caregiver. °ACTIVITY °· Rest as much as possible the first two weeks after discharge. °· Arrange to have help from family or others with your daily activities when you go home. °· Avoid heavy lifting (more than 5 pounds), pushing, or pulling. °· If you feel tired, balance your activity with rest periods. °· Follow your caregiver's instruction about climbing stairs and driving a car. °· Increase activity gradually. °· Do not exercise until you have permission from your caregiver. °LEG AND FOOT CARE °If your doctor has removed lymph nodes from your groin area, there may be an increase in swelling of your legs and feet. You can help prevent swelling by doing the following: °· Elevate your legs while sitting or lying down. °· If your caregiver has ordered special stockings, wear them according to instructions. °· Avoid standing in one place for long periods of time. °· Call the physical therapy department if you have any questions about swelling or treatment  for swelling. °· Avoid salt in your diet. It can cause fluid retention and swelling. °· Do not cross your legs, especially when sitting. °NUTRITION °· You may resume your normal diet. °· Drink 6 to 8 glasses of fluids a day. °· Eat a healthy, balanced diet including portions of food from the meat (protein), milk, fruit, vegetable, and bread groups. °· Your caregiver may recommend you take a multivitamin with iron. °ELIMINATION °· You may notice that your stream of urine is at a different angle, and may tend to spray. Using a plastic funnel may help to decrease urine spray. °· If constipation occurs, drink more liquids, and add more fruits, vegetables, and bran to your diet. You may take a mild laxative, such as Milk of Magnesia, Metamucil, or a stool softener such as Colace, with permission from your caregiver. °HYGIENE °· You may shower and wash your hair. °· Check with your caregiver about tub baths. °· Do not add any bath oils or chemicals to your bath water, after you have permission to take baths. °· While passing urine, pour water from a bottle or spray over your vulva to dilute the urine as it passes the incision (this will decrease burning and discomfort). °· Clean yourself well after moving your bowels. °· After urinating, do not wipe. Dap or pat dry with toilet paper or a dry cleath soft cloth. °· A sitz bath will help keep your perineal area clean, reduce swelling, and provide comfort. °· Avoid wearing underpants for the first 2 weeks and wear loose skirts to   allow circulation of air around the incision °· You do not need to apply dressings, salves or lotions to the wound. °· The stitches are self-dissolving and will absorb and disappear over a couple of months (it is normal to notice the knot from the stitches on toilet paper after voiding). °HOME CARE INSTRUCTIONS  °· Apply a soft ice pack (or frozen bag of peas) to your perineum (vulva) every hour in the first 48 hours after surgery. This will reduce  swelling. °· Avoid activities that involve a lot of friction between your legs. °· Avoid wearing pants or underpants in the 1st 2 weeks (skirts are preferable). °· Take your temperature twice a day and record it, especially if you feel feverish or have chills. °· Follow your caregiver's instructions about medicines, activity, and follow-up appointments after surgery. °· Do not drink alcohol while taking pain medicine. °· Change your dressing as advised by your caregiver. °· You may take over-the-counter medicine for pain, recommended by your caregiver. °· If your pain is not relieved with medicine, call your caregiver. °· Do not take aspirin because it can cause bleeding. °· Do not douche or use tampons (use a nonperfumed sanitary pad). °· Do not have sexual intercourse until your caregiver gives you permission (typically 6 weeks postoperatively). Hugging, kissing, and playful sexual activity is fine with your caregiver's permission. °· Warm sitz baths, with your caregiver's permission, are helpful to control swelling and discomfort. °· Take showers instead of baths, until your caregiver gives you permission to take baths. °· You may take a mild medicine for constipation, recommended by your caregiver. Bran foods and drinking a lot of fluids will help with constipation. °· Make sure your family understands everything about your operation and recovery. °SEEK MEDICAL CARE IF:  °· You notice swelling and redness around the wound area. °· You notice a foul smell coming from the wound or on the surgical dressing. °· You notice the wound is separating. °· You have painful or bloody urination. °· You develop nausea and vomiting. °· You develop diarrhea. °· You develop a rash. °· You have a reaction or allergy from the medicine. °· You feel dizzy or light-headed. °· You need stronger pain medicine. °SEEK IMMEDIATE MEDICAL CARE IF:  °· You develop a temperature of 102° F (38.9° C) or higher. °· You pass out. °· You develop  leg or chest pain. °· You develop abdominal pain. °· You develop shortness of breath. °· You develop bleeding from the wound area. °· You see pus in the wound area. °MAKE SURE YOU:  °· Understand these instructions. °· Will watch your condition. °· Will get help right away if you are not doing well or get worse. °Document Released: 03/22/2004 Document Revised: 12/23/2013 Document Reviewed: 07/10/2009 °[employer]® Patient Information ©2015 [employer]. This information is not intended to replace advice given to you by your health care provider. Make sure you discuss any questions you have with your health care provider. °Post Anesthesia Home Care Instructions ° °Activity: °Get plenty of rest for the remainder of the day. A responsible adult should stay with you for 24 hours following the procedure.  °For the next 24 hours, DO NOT: °-Drive a car °-Operate machinery °-Drink alcoholic beverages °-Take any medication unless instructed by your physician °-Make any legal decisions or sign important papers. ° °Meals: °Start with liquid foods such as gelatin or soup. Progress to regular foods as tolerated. Avoid greasy, spicy, heavy foods. If nausea and/or vomiting occur, drink   only clear liquids until the nausea and/or vomiting subsides. Call your physician if vomiting continues. ° °Special Instructions/Symptoms: °Your throat may feel dry or sore from the anesthesia or the breathing tube placed in your throat during surgery. If this causes discomfort, gargle with warm salt water. The discomfort should disappear within 24 hours. ° °If you had a scopolamine patch placed behind your ear for the management of post- operative nausea and/or vomiting: ° °1. The medication in the patch is effective for 72 hours, after which it should be removed.  Wrap patch in a tissue and discard in the trash. Wash hands thoroughly with soap and water. °2. You may remove the patch earlier than 72 hours if you experience unpleasant side effects  which may include dry mouth, dizziness or visual disturbances. °3. Avoid touching the patch. Wash your hands with soap and water after contact with the patch. °  ° °

## 2015-04-21 NOTE — Anesthesia Preprocedure Evaluation (Addendum)
Anesthesia Evaluation  Patient identified by MRN, date of birth, ID band Patient awake    Reviewed: Allergy & Precautions, H&P , NPO status , Patient's Chart, lab work & pertinent test results  History of Anesthesia Complications Negative for: history of anesthetic complications  Airway Mallampati: I  TM Distance: >3 FB Neck ROM: full    Dental no notable dental hx.    Pulmonary neg pulmonary ROS,  breath sounds clear to auscultation  Pulmonary exam normal       Cardiovascular negative cardio ROS Normal cardiovascular examRhythm:regular Rate:Normal     Neuro/Psych PSYCHIATRIC DISORDERS Anxiety Depression negative neurological ROS     GI/Hepatic Neg liver ROS, GERD-  ,  Endo/Other  negative endocrine ROS  Renal/GU negative Renal ROS     Musculoskeletal   Abdominal   Peds  Hematology negative hematology ROS (+)   Anesthesia Other Findings NPO appropriate, allergies reviewed Denies active cardiac or pulmonary symptoms, METS > 4 No recent congestive cough or symptoms of upper respiratory infection    Reproductive/Obstetrics negative OB ROS                            Anesthesia Physical Anesthesia Plan  ASA: II  Anesthesia Plan: General   Post-op Pain Management:    Induction: Intravenous  Airway Management Planned: LMA  Additional Equipment:   Intra-op Plan:   Post-operative Plan: Extubation in OR  Informed Consent: I have reviewed the patients History and Physical, chart, labs and discussed the procedure including the risks, benefits and alternatives for the proposed anesthesia with the patient or authorized representative who has indicated his/her understanding and acceptance.     Plan Discussed with: Anesthesiologist, CRNA and Surgeon  Anesthesia Plan Comments:         Anesthesia Quick Evaluation

## 2015-04-21 NOTE — Anesthesia Postprocedure Evaluation (Signed)
  Anesthesia Post-op Note  Patient: [name]  Procedure(s) Performed: Procedure(s) (LRB): WIDE EXCISION OF VULVAR (N/A)  Patient Location: PACU  Anesthesia Type: General  Level of Consciousness: awake and alert   Airway and Oxygen Therapy: Patient Spontanous Breathing  Post-op Pain: mild  Post-op Assessment: Post-op Vital signs reviewed, Patient's Cardiovascular Status Stable, Respiratory Function Stable, Patent Airway and No signs of Nausea or vomiting  Last Vitals:  Filed Vitals:   04/21/15 1100  BP: 101/58  Pulse: 69  Temp:   Resp: 15    Post-op Vital Signs: stable   Complications: No apparent anesthesia complications

## 2015-04-21 NOTE — Transfer of Care (Signed)
Immediate Anesthesia Transfer of Care Note  Patient: [name]  Procedure(s) Performed: Procedure(s): WIDE EXCISION OF VULVAR (N/A)  Patient Location: PACU  Anesthesia Type:General  Level of Consciousness: awake, alert , oriented and patient cooperative  Airway & Oxygen Therapy: Patient Spontanous Breathing and Patient connected to nasal cannula oxygen  Post-op Assessment: Report given to RN and Post -op Vital signs reviewed and stable  Post vital signs: Reviewed and stable  Last Vitals:  Filed Vitals:   04/21/15 0825  BP: 118/69  Pulse: 82  Temp: 36.9 C  Resp: 16    Complications: No apparent anesthesia complications

## 2015-04-21 NOTE — Op Note (Signed)
PATIENT: [name] DATE: 04/21/15   Preop Diagnosis: VIN3  Postoperative Diagnosis: same  Surgery: Partial simple partial posterior vulvectomy  Surgeons:  Quinn Axe, MD Assistant: none  Anesthesia: General   Estimated blood loss: 20ml  IVF:    Urine output: 20 ml   Complications: None   Pathology: perineal body with marking stitch at 12 o'clock anterior midline, right anterior margin  Operative findings: 2 acetowhite areas on right posterior labia minora and left of the midline perineal body with a bridge of abnormal appearing epithelium in between.  Procedure: The patient was identified in the preoperative holding area. Informed consent was signed on the chart. Patient was seen history was reviewed and exam was performed.   The patient was then taken to the operating room and placed in the supine position with SCD hose on. General anesthesia was then induced without difficulty. She was then placed in the dorsolithotomy position. The perineum was prepped with Betadine. The vagina was prepped with Betadine. The patient was then draped after the prep was dried. A Foley catheter was inserted into the bladder under sterile conditions.  Timeout was performed the patient, procedure, antibiotic, allergy, and length of procedure. 5% acetic acid solution was applied to the perineum. The vulvar tissues were inspected for areas of acetowhite changes or leukoplakia. The lesion was identified and the marking pen was used to circumscribe the area with appropriate surgical margins. The subcuticular tissues were infiltrated with 1% lidocaine. The 15 blade scalpel was used to make an incision through the skin circumferentially as marked. The skin elipse was grasped and was separated from the underlying deep dermal tissues with the bovie device. After the specimen had been completely resected, it was oriented and marked at 12 o'clock in the midline of the perineal body  anteriorallywith a 0-vicryl suture. A separate fragment of the right anterior margin was retrieved separately. The bovie was used to obtain hemostasis at the surgical bed. The subcutaneous tissues were irrigated and made hemostatic.   The deep dermal layer was approximated with 3-0vicryl mattress sutures to bring the skin edges into approximation and off tension. The wound was closed following langher's lines. The cutaneous layer was closed with interrupted 4-0 vicryl stitches and mattress sutures to ensure a tension free and hemostatic closure. The perineum was again irrigated. The foley was removed.  All instrument, suture, laparotomy, Ray-Tec, and needle counts were correct x2. The patient tolerated the procedure well and was taken recovery room in stable condition. This is Adolphus Birchwood dictating an operative note on [employer].  Quinn Axe, MD

## 2015-04-22 ENCOUNTER — Encounter (HOSPITAL_BASED_OUTPATIENT_CLINIC_OR_DEPARTMENT_OTHER): Payer: Self-pay | Admitting: Gynecologic Oncology

## 2015-04-22 ENCOUNTER — Telehealth: Payer: Self-pay | Admitting: *Deleted

## 2015-04-22 LAB — POCT HEMOGLOBIN-HEMACUE: Hemoglobin: 14.2 g/dL (ref 12.0–15.0)

## 2015-04-22 NOTE — Telephone Encounter (Signed)
Notified pt of scheduled appointment. Pt is scheduled on 05/04/15 with Dr. Andrey Farmer @ 9:45am. Pt agreed with time and date of appointment

## 2015-04-23 ENCOUNTER — Telehealth: Payer: Self-pay | Admitting: *Deleted

## 2015-04-23 NOTE — Telephone Encounter (Signed)
Received VM from patient requesting return call in regards to pain after procedure she had on Tuesday. Called and spoke to patient - she states she is in an extreme amount of pain in her vulvar area. States she has been taking the percocet every 4 hours during the day and lasting 6 hours overnight. Patient states she has been using ice several times a day on the area as well. Pt denies taking ibuprofen - encouraged patient to try taking OTC ibuprofen in between doses of Percocet. Patient hasn't moved her bowels yet but has been taking colace BID and states she just took a dose of miralax this morning. Encouraged patient to continue stool softeners and miralax. No other concerns noted at this time.  Requested patient call our office tomorrow with an update prior to the weekend - patient agreeable to this.

## 2015-04-24 ENCOUNTER — Telehealth: Payer: Self-pay | Admitting: Gynecologic Oncology

## 2015-04-24 ENCOUNTER — Telehealth: Payer: Self-pay | Admitting: *Deleted

## 2015-04-24 NOTE — Telephone Encounter (Signed)
Called to check on patient's status. Per pt, she started alternating ibuprofen with the Percocet as discussed on the phone yesterday. She is noting some relief and is continuing to use ice on the surgical area. Patient states her bowels moved yesterday and she took Miralax again this morning and is continuing the stool softeners. Instructed patient to please call with any further concerns prior to her scheduled appt.

## 2015-04-24 NOTE — Telephone Encounter (Signed)
Informed patient of VIN3 results. She is doing well healing. Quinn Axe, MD

## 2015-04-28 ENCOUNTER — Other Ambulatory Visit: Payer: Self-pay | Admitting: Gynecologic Oncology

## 2015-04-28 ENCOUNTER — Telehealth: Payer: Self-pay | Admitting: Nurse Practitioner

## 2015-04-28 DIAGNOSIS — G8918 Other acute postprocedural pain: Secondary | ICD-10-CM

## 2015-04-28 MED ORDER — OXYCODONE-ACETAMINOPHEN 10-325 MG PO TABS: 1.0000 | ORAL_TABLET | ORAL | Status: DC | PRN

## 2015-04-28 NOTE — Progress Notes (Signed)
See progress note from Criss Alvine, RN

## 2015-04-28 NOTE — Telephone Encounter (Signed)
Patient calling to report perineal/vulvar pain after surgery. Patient has been using percocet and advil and stool softeners since surgery. Last BM today. Denies fever but reports occasional shakes and consistent odor since surgery. Unable to see wound. She performs sitz baths and uses peri bottle as prescribed. Pain 7/10 on percocet with 6 pills remaining. Per Warner Mccreedy, NP, continue pain medication and post op instructions, add tea bag to sitz bath for pain relieving qualities; sit on inner tube to relieve pressure; call clinic with any new or worsening symptoms such as fever or change in odor or discharge. Patient will come to [facility] later today for percocet refill. She is informed we can move her post op apt up from 05/04/15 for any urgent needs. She verbalizes understanding of the above.

## 2015-04-30 ENCOUNTER — Telehealth: Payer: Self-pay | Admitting: *Deleted

## 2015-04-30 NOTE — Telephone Encounter (Signed)
Pt called with c/o pain to incision site. Tender to touch, light brown discharge with mild odor. Pt states " I'm taking my pain medicine every 4 hrs and alternating with advil." Pt advised she has not taken her temp, unsure if having any fevers. Pt requesting appt sooner than 9/12. appt given to pt for 9/9 3pm. Encourage pt to continue to monitor temp; call if over 100.5, use sitz bath, keep area clean and dry, take pain meds. Pt verbalized understanding. No further concerns

## 2015-05-01 ENCOUNTER — Ambulatory Visit: Payer: 59 | Attending: Gynecologic Oncology | Admitting: Gynecologic Oncology

## 2015-05-01 ENCOUNTER — Encounter: Payer: Self-pay | Admitting: Gynecologic Oncology

## 2015-05-01 VITALS — BP 108/74 | HR 82 | Temp 98.0°F | Resp 18 | Ht 61.5 in | Wt 159.2 lb

## 2015-05-01 DIAGNOSIS — D071 Carcinoma in situ of vulva: Secondary | ICD-10-CM | POA: Insufficient documentation

## 2015-05-01 MED ORDER — OXYCODONE-ACETAMINOPHEN 10-325 MG PO TABS: 1.0000 | ORAL_TABLET | ORAL | Status: DC | PRN

## 2015-05-01 NOTE — Patient Instructions (Signed)
We will see you back in about 1 week for a post-operative check. Please call our clinic before your next appointment with any new or worsening needs. Continue wound care as directed by Dr. Andrey Farmer.

## 2015-05-01 NOTE — Progress Notes (Signed)
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   S/p partial simple posterior vulvectomy on 03/26/15. Wound separation without evidence of infection.  Plan: 1) Pathology reports reviewed today 2) Treatment counseling - Discussed benign pathology, needs close followup with perineal inspection every 6 months for 2 years. High risk (25%) for recurrence.  She was given the opportunity to ask questions, which were answered to her satisfaction, and she is agreement with the above mentioned plan of care. 3) wound separation - recommended continued whirl pooling with hand held shower, sitz baths and perineal rest. 4)  Return to clinic in 1 week for wound inspection  HPI:  Carolynn Tuley is a 44 y.o. year old [id number] initially seen in consultation on 04/02/15 referred by Dr Henderson Cloud for [facility].  She then underwent a simple partial vuvlectomy of the perineum and posterior left vulva on 04/15/15 without complications.  Her postoperative course was uncomplicated.  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  She is seen today for a postoperative check and to discuss her pathology results and ongoing plan.  Since discharge from the hospital, she is feeling overall well but has some brown drainage from the perineum and some pruritis.  She has improving appetite, normal bowel and bladder function, and pain controlled with PO medication and she is requesting a refill. She has no other complaints today.  Review of systems: Constitutional:  She has no weight gain or weight loss. She has no fever or chills. Eyes: No blurred vision Ears, Nose, Mouth, Throat: No dizziness, headaches or changes in hearing. No mouth sores. Cardiovascular: No chest pain, palpitations or edema. Respiratory:  No shortness of breath, wheezing or cough Gastrointestinal: She has normal bowel movements without diarrhea or constipation. She denies any nausea or  vomiting. She denies blood in her stool or heart burn. Genitourinary:  She denies pelvic pain, pelvic pressure or changes in her urinary function. She has no hematuria, dysuria, or incontinence. She has no irregular vaginal bleeding or vaginal discharge Musculoskeletal: Denies muscle weakness or joint pains.  Skin:  She has no skin changes, rashes or itching Neurological:  Denies dizziness or headaches. No neuropathy, no numbness or tingling. Psychiatric:  She denies depression or anxiety. Hematologic/Lymphatic:   No easy bruising or bleeding   Physical Exam: Blood pressure 108/74, pulse 82, temperature 98 F (36.7 C), temperature source Oral, resp. rate 18, height 5' 1.5" (1.562 m), weight 159 lb 3.2 oz (72.213 kg), last menstrual period 04/04/2015, SpO2 99 %. General: Well dressed, well nourished in no apparent distress.   HEENT:  Normocephalic and atraumatic, no lesions.  Extraocular muscles intact. Sclerae anicteric. Pupils equal, round, reactive. No mouth sores or ulcers. Thyroid is normal size, not nodular, midline. Skin:  No lesions or rashes. Breasts: deferred Lungs:  deferred Cardiovascular:  deferred Abdomen:  deferred Genitourinary: posterior vulvar wound overall healing well. 1cm area of wound separation at midline perineal body with clean bleeding granulation tissue at base. Extremities: No cyanosis, clubbing or edema.  No calf tenderness or erythema. No palpable cords. Psychiatric: Mood and affect are appropriate. Neurological: Awake, alert and oriented x 3. Sensation is intact, no neuropathy.  Musculoskeletal: No pain, normal strength and range of motion.  Quinn Axe, MD

## 2015-05-04 ENCOUNTER — Ambulatory Visit: Payer: 59 | Admitting: Gynecologic Oncology

## 2015-05-11 ENCOUNTER — Ambulatory Visit: Payer: 59 | Attending: Gynecologic Oncology | Admitting: Gynecologic Oncology

## 2015-05-11 ENCOUNTER — Encounter: Payer: Self-pay | Admitting: Gynecologic Oncology

## 2015-05-11 VITALS — BP 101/63 | HR 62 | Temp 98.0°F | Resp 18

## 2015-05-11 DIAGNOSIS — D071 Carcinoma in situ of vulva: Secondary | ICD-10-CM | POA: Insufficient documentation

## 2015-05-11 NOTE — Patient Instructions (Signed)
Follow up in 6 months 

## 2015-05-11 NOTE — Progress Notes (Signed)
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   S/p partial simple posterior vulvectomy on 03/26/15. Wound separation without evidence of infection.  Plan: 1) Pathology reports reviewed today 2) Treatment counseling - Discussed benign pathology, needs close followup with perineal inspection every 6 months for 2 years. High risk (25%) for recurrence.  She was given the opportunity to ask questions, which were answered to her satisfaction, and she is agreement with the above mentioned plan of care. 3) wound separation - recommended continued whirl pooling with hand held shower, sitz baths and perineal rest. Recommended neosporin application to wound base after toiletting. 4)  Return to clinic in 6 months for vulva inspection with acetic acid. I will then have her followup with Dr Henderson Cloud if she is doing well.  HPI:  [name] is a 44 y.o. year old [id number] initially seen in consultation on 04/02/15 referred by Dr Henderson Cloud for [facility].  She then underwent a simple partial vuvlectomy of the perineum and posterior left vulva on 04/15/15 without complications.  Her postoperative course was uncomplicated.  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  She is seen today for a postoperative check and to discuss her pathology results and ongoing plan.  Since discharge from the hospital, she is feeling overall well but has some brown drainage from the perineum and some pruritis.  She has improving appetite, normal bowel and bladder function, and pain controlled with PO medication and she is requesting a refill. She has no other complaints today.  Review of systems: Constitutional:  She has no weight gain or weight loss. She has no fever or chills. Eyes: No blurred vision Ears, Nose, Mouth, Throat: No dizziness, headaches or changes in hearing. No mouth sores. Cardiovascular: No chest pain, palpitations or edema. Respiratory:   No shortness of breath, wheezing or cough Gastrointestinal: She has normal bowel movements without diarrhea or constipation. She denies any nausea or vomiting. She denies blood in her stool or heart burn. Genitourinary:  She denies pelvic pain, pelvic pressure or changes in her urinary function. She has no hematuria, dysuria, or incontinence. She has no irregular vaginal bleeding or vaginal discharge Musculoskeletal: Denies muscle weakness or joint pains.  Skin:  She has no skin changes, rashes or itching Neurological:  Denies dizziness or headaches. No neuropathy, no numbness or tingling. Psychiatric:  She denies depression or anxiety. Hematologic/Lymphatic:   No easy bruising or bleeding   Physical Exam: Blood pressure 101/63, pulse 62, temperature 98 F (36.7 C), temperature source Oral, resp. rate 18. General: Well dressed, well nourished in no apparent distress.   HEENT:  Normocephalic and atraumatic, no lesions.  Extraocular muscles intact. Sclerae anicteric. Pupils equal, round, reactive. No mouth sores or ulcers. Thyroid is normal size, not nodular, midline. Skin:  No lesions or rashes. Breasts: deferred Lungs:  deferred Cardiovascular:  deferred Abdomen:  deferred Genitourinary: posterior vulvar wound overall healing well. 1cm area of wound separation at midline perineal body with clean bleeding granulation tissue at base. Extremities: No cyanosis, clubbing or edema.  No calf tenderness or erythema. No palpable cords. Psychiatric: Mood and affect are appropriate. Neurological: Awake, alert and oriented x 3. Sensation is intact, no neuropathy.  Musculoskeletal: No pain, normal strength and range of motion.  Quinn Axe, MD

## 2015-06-01 ENCOUNTER — Telehealth: Payer: Self-pay | Admitting: Family Medicine

## 2015-06-01 ENCOUNTER — Ambulatory Visit: Payer: 59 | Admitting: Family Medicine

## 2015-06-01 NOTE — Telephone Encounter (Signed)
No charge she called

## 2015-06-01 NOTE — Telephone Encounter (Signed)
Due to unexpected child care issues patient had to cancel her 4pm appointment today- charge or no charge

## 2015-06-02 ENCOUNTER — Ambulatory Visit: Payer: 59 | Attending: Gynecologic Oncology | Admitting: Gynecologic Oncology

## 2015-06-02 VITALS — BP 110/81 | HR 88 | Temp 98.2°F | Wt 165.5 lb

## 2015-06-02 DIAGNOSIS — B373 Candidiasis of vulva and vagina: Secondary | ICD-10-CM | POA: Diagnosis not present

## 2015-06-02 DIAGNOSIS — D071 Carcinoma in situ of vulva: Secondary | ICD-10-CM | POA: Diagnosis not present

## 2015-06-02 DIAGNOSIS — B3731 Acute candidiasis of vulva and vagina: Secondary | ICD-10-CM

## 2015-06-02 MED ORDER — FLUCONAZOLE 100 MG PO TABS: 100.0000 mg | ORAL_TABLET | Freq: Every day | ORAL | Status: DC

## 2015-06-02 NOTE — Patient Instructions (Signed)
We will send in diflucan one tablet once daily for three days to treat the yeast infection of your vulva.  Plan to use the peri-bottle as well.  If you are doing better in two weeks, please call the office and cancel your appointment.  Please call for any questions or concerns.

## 2015-06-03 NOTE — Progress Notes (Signed)
Follow Up Note: Gyn-Onc  [name] 44 y.o. female  CC:  Chief Complaint  Patient presents with  . VIN III    Follow up    HPI:  [name] is a 44 y.o. year old [id number] initially seen in consultation on 04/02/15 [facility] She then underwent a simple partial vuvlectomy of the perineum and posterior left vulva on 04/15/15 without complications. Her postoperative course was uncomplicated. 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  Interval History:  She presents today for a vulvar check.  She had called the office with concerns about vulvar pain.  She reports doing well after surgery but states she has moderate vulvar pain with movement.  She wants to know when she can start playing tennis and doing strenuous activities.  Appetite good.  Bowels and bladder functioning without difficulty.  She reports having a yeast infection in the past and it takes several doses of diflucan to treat.  She reports vaginal itching intermittently but no discharge to her knowledge.  No other concerns voiced.    Review of Systems  Constitutional: Feels well.  No fever, chills, early satiety.  Cardiovascular: No chest pain, shortness of breath, or edema.  Pulmonary: No cough or wheeze.  Gastrointestinal: No nausea, vomiting, or diarrhea. No bright red blood per rectum or change in bowel movement.  Genitourinary: No frequency, urgency, or dysuria. No vaginal bleeding or discharge.  Musculoskeletal: No myalgia or joint pain. Neurologic: No weakness, numbness, or change in gait.  Psychology: No depression, anxiety, or insomnia  Current Meds:  Outpatient Encounter Prescriptions as of 06/02/2015  Medication Sig  . ALPRAZolam (XANAX) 0.25 MG tablet Take 1 tablet (0.25 mg total) by mouth at bedtime as needed for anxiety.  . Ascorbic Acid (VITAMIN C) 1000 MG tablet Take 1,000 mg by mouth daily.  . cetirizine (ZYRTEC) 10 MG tablet Take 10 mg by mouth every morning.    . cholecalciferol (VITAMIN D) 1000 UNITS tablet Take 1,000 Units by mouth daily.  . cyclobenzaprine (FLEXERIL) 10 MG tablet TAKE 1 TABLET BY MOUTH AT BEDTIME AS NEEDED FOR MUSCLE SPASM  . docusate sodium (COLACE) 100 MG capsule Take 1 capsule (100 mg total) by mouth 2 (two) times daily.  Marland Kitchen. escitalopram (LEXAPRO) 10 MG tablet Take 1 tablet (10 mg total) by mouth daily. (Patient taking differently: Take 10 mg by mouth every morning. )  . ferrous fumarate (HEMOCYTE - 106 MG FE) 325 (106 FE) MG TABS tablet Take 1 tablet by mouth daily.  . fluconazole (DIFLUCAN) 100 MG tablet Take 1 tablet (100 mg total) by mouth daily.  Marland Kitchen. gabapentin (NEURONTIN) 100 MG capsule   . magnesium 30 MG tablet Take 30 mg by mouth daily.  . methylPREDNISolone (MEDROL DOSEPAK) 4 MG TBPK tablet TAKE AS DIRECTED OVER 6 DAYS  . Multiple Vitamins-Minerals (MULTIVITAMIN ADULT PO) Take 1 tablet by mouth daily. Pt takes One-A-Day brand  . naproxen (NAPROSYN) 500 MG tablet TAKE 1 TABLET BY MOUTH PBID WITH A MEAL  . nystatin cream (MYCOSTATIN) Apply 1 application topically 2 (two) times daily.  Marland Kitchen. omeprazole (PRILOSEC) 40 MG capsule Take 40 mg by mouth every morning.   Marland Kitchen. oxyCODONE-acetaminophen (PERCOCET) 10-325 MG per tablet Take 1 tablet by mouth every 4 (four) hours as needed for pain.  . traMADol (ULTRAM) 50 MG tablet Take 50 mg by mouth every 6 (six) hours as needed.  . valACYclovir (VALTREX) 1000 MG tablet Take 1 tablet (1,000 mg total) by mouth  daily. (Patient taking differently: Take 1,000 mg by mouth every morning. )  . vitamin B-12 (CYANOCOBALAMIN) 1000 MCG tablet Take 1,000 mcg by mouth daily.   No facility-administered encounter medications on file as of 06/02/2015.    Allergy:  Allergies  Allergen Reactions  . Amoxicillin-Pot Clavulanate Rash  . Famvir [Famciclovir] Photosensitivity  . Penicillins Rash    Right forearm erythema on volar aspect only    Social Hx:   Social History   Social History  . Marital  Status: Married    Spouse Name: N/A  . Number of Children: 4  . Years of Education: N/A   Occupational History  . 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 Smoking status: Never Smoker   . Smokeless tobacco: Never Used  . Alcohol Use: No  . Drug Use: No  . Sexual Activity:    Partners: Male    Birth Control/ Protection: Surgical   Other Topics Concern  . Not on file   Social History Narrative   Lives with husband and 4 children in a 3 story home.  Does not work.  Education: college    Past Surgical Hx:  Past Surgical History  Procedure Laterality Date  . Cesarean section  x4  last one 10-14-2010    w/ Bilateral Tubal Ligation  . Inner ear surgery  1997  . Appendectomy  1997  . Esophagogastroduodenoscopy  01-22-2015  . Dilation and evacuation  x2  . Vulvectomy N/A 04/21/2015    Procedure: WIDE EXCISION OF VULVAR;  Surgeon: Adolphus Birchwood, MD;  Location: [facility];  Service: Gynecology;  Laterality: N/A;    Past Medical Hx:  Past Medical History  Diagnosis Date  . GERD (gastroesophageal reflux disease)   . Pruritus 04/07/2015  . Anxiety and depression 04/07/2015  . Genital HSV   . VIN III (vulvar intraepithelial neoplasia III)     Family Hx:  Family History  Problem Relation Age of Onset  . Hypertension Mother   . Heart disease Father     smoker  . Dementia Maternal Grandmother   . Heart disease Maternal Grandmother     s/p MI  . COPD Maternal Grandfather     smoker  . Cancer Maternal Grandfather     intestinal  . Colon cancer Neg Hx   . Colon polyps Neg Hx   . Diabetes Neg Hx     Vitals:  Blood pressure 110/81, pulse 88, temperature 98.2 F (36.8 C), temperature source Oral, weight 165 lb 8 oz (75.07 kg).  Physical Exam:  General: Well developed, well nourished female in no acute distress. Alert and oriented x 3.  Cardiovascular: Regular rate and rhythm. S1 and S2 normal.  Lungs: Clear to auscultation bilaterally. No  wheezes/crackles/rhonchi noted.  Skin: No rashes or lesions present. Back: No CVA tenderness.  Genitourinary:    Vulva/vagina: Moderate candidiasis noted on the external vulva. Posterior vulvar wound overall healing well with continued 1cm area of wound separation at midline perineal body with granulation tissue at base.  Area appears slightly irritated from surrounding candidiasis.       Urethra: No lesions or masses.  Extremities: No bilateral cyanosis or clubbing. Mild bilateral non-pitting edema noted.   Assessment/Plan:  44 year old s/p partial simple posterior vulvectomy on 03/26/15 with wound separation for VIN III.  She is advised that we will treat the yeast with diflucan one tablet daily for three days.  She is advised she could also use monistat external  cream on the vulva away from the incision for symptom relief.  Advised to call the office if the discomfort persists or worsens.  Advised to incorporate activities slowly and if pain develops to stop the activity at that time.  She is advised to continue the use of the peri-bottle after toileting and to rinse the yeast from the healing wound.  Reportable signs and symptoms reviewed.     Rilee Knoll DEAL, NP 06/03/2015, 11:40 AM

## 2015-06-15 ENCOUNTER — Ambulatory Visit: Payer: 59 | Attending: Gynecologic Oncology | Admitting: Gynecologic Oncology

## 2015-06-15 ENCOUNTER — Encounter: Payer: Self-pay | Admitting: Gynecologic Oncology

## 2015-06-15 VITALS — BP 116/73 | HR 74 | Temp 98.1°F | Resp 18 | Ht 61.5 in | Wt 166.5 lb

## 2015-06-15 DIAGNOSIS — D071 Carcinoma in situ of vulva: Secondary | ICD-10-CM | POA: Diagnosis not present

## 2015-06-15 NOTE — Patient Instructions (Signed)
You are free to resume all activities including tennis, intercourse, use of tampons, etc.  Please call for any questions or concerns.

## 2015-06-15 NOTE — Progress Notes (Signed)
Follow Up Note: Gyn-Onc  [name] 44 y.o. female  CC:  Chief Complaint  Patient presents with  . VIN III    Follow up    HPI:  [name] is a 44 y.o. year old [id number] initially seen in consultation on 04/02/15 [facility] Dr [facility] She then underwent a simple partial vuvlectomy of the perineum and posterior left vulva on 04/15/15 without complications. Her postoperative course was uncomplicated. 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  Interval History:  She presents today for a vulvar check.  She had been evaluated earlier in October and had been diagnosed with vulvo-vaginal candida. She has been treated with Diflucan. She reports improved symptoms from this.  Review of Systems  Constitutional: Feels well.  No fever, chills, early satiety.  Cardiovascular: No chest pain, shortness of breath, or edema.  Pulmonary: No cough or wheeze.  Gastrointestinal: No nausea, vomiting, or diarrhea. No bright red blood per rectum or change in bowel movement.  Genitourinary: No frequency, urgency, or dysuria. No vaginal bleeding or discharge.  Musculoskeletal: No myalgia or joint pain. Neurologic: No weakness, numbness, or change in gait.  Psychology: No depression, anxiety, or insomnia  Current Meds:  Outpatient Encounter Prescriptions as of 06/15/2015  Medication Sig  . ALPRAZolam (XANAX) 0.25 MG tablet Take 1 tablet (0.25 mg total) by mouth at bedtime as needed for anxiety.  . Ascorbic Acid (VITAMIN C) 1000 MG tablet Take 1,000 mg by mouth daily.  . cetirizine (ZYRTEC) 10 MG tablet Take 10 mg by mouth every morning.   . cholecalciferol (VITAMIN D) 1000 UNITS tablet Take 1,000 Units by mouth daily.  Marland Kitchen. escitalopram (LEXAPRO) 10 MG tablet Take 1 tablet (10 mg total) by mouth daily. (Patient taking differently: Take 10 mg by mouth every morning. )  . ferrous fumarate (HEMOCYTE - 106 MG FE) 325 (106 FE) MG TABS tablet Take 1 tablet by mouth daily.  .  magnesium 30 MG tablet Take 30 mg by mouth daily.  . Multiple Vitamins-Minerals (MULTIVITAMIN ADULT PO) Take 1 tablet by mouth daily. Pt takes One-A-Day brand  . valACYclovir (VALTREX) 1000 MG tablet Take 1 tablet (1,000 mg total) by mouth daily. (Patient taking differently: Take 1,000 mg by mouth every morning. )  . vitamin B-12 (CYANOCOBALAMIN) 1000 MCG tablet Take 1,000 mcg by mouth daily.  . [DISCONTINUED] docusate sodium (COLACE) 100 MG capsule Take 1 capsule (100 mg total) by mouth 2 (two) times daily.  Marland Kitchen. omeprazole (PRILOSEC) 40 MG capsule Take 40 mg by mouth every morning.   . [DISCONTINUED] cyclobenzaprine (FLEXERIL) 10 MG tablet TAKE 1 TABLET BY MOUTH AT BEDTIME AS NEEDED FOR MUSCLE SPASM  . [DISCONTINUED] fluconazole (DIFLUCAN) 100 MG tablet Take 1 tablet (100 mg total) by mouth daily.  . [DISCONTINUED] gabapentin (NEURONTIN) 100 MG capsule   . [DISCONTINUED] methylPREDNISolone (MEDROL DOSEPAK) 4 MG TBPK tablet TAKE AS DIRECTED OVER 6 DAYS  . [DISCONTINUED] naproxen (NAPROSYN) 500 MG tablet TAKE 1 TABLET BY MOUTH PBID WITH A MEAL  . [DISCONTINUED] nystatin cream (MYCOSTATIN) Apply 1 application topically 2 (two) times daily.  . [DISCONTINUED] oxyCODONE-acetaminophen (PERCOCET) 10-325 MG per tablet Take 1 tablet by mouth every 4 (four) hours as needed for pain.  . [DISCONTINUED] traMADol (ULTRAM) 50 MG tablet Take 50 mg by mouth every 6 (six) hours as needed.   No facility-administered encounter medications on file as of 06/15/2015.    Allergy:  Allergies  Allergen Reactions  . Amoxicillin-Pot Clavulanate Rash  . Famvir [  Famciclovir] Photosensitivity  . Penicillins Rash    Right forearm erythema on volar aspect only    Social Hx:   Social History   Social History  . Marital Status: Married    Spouse Name: N/A  . Number of Children: 4  . Years of Education: N/A   Occupational History  . 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 Smoking status: Never Smoker   .  Smokeless tobacco: Never Used  . Alcohol Use: No  . Drug Use: No  . Sexual Activity:    Partners: Male    Birth Control/ Protection: Surgical   Other Topics Concern  . Not on file   Social History Narrative   Lives with husband and 4 children in a 3 story home.  Does not work.  Education: college    Past Surgical Hx:  Past Surgical History  Procedure Laterality Date  . Cesarean section  x4  last one 10-14-2010    w/ Bilateral Tubal Ligation  . Inner ear surgery  1997  . Appendectomy  1997  . Esophagogastroduodenoscopy  01-22-2015  . Dilation and evacuation  x2  . Vulvectomy N/A 04/21/2015    Procedure: WIDE EXCISION OF VULVAR;  Surgeon: Adolphus Birchwood, MD;  Location: [facility];  Service: Gynecology;  Laterality: N/A;    Past Medical Hx:  Past Medical History  Diagnosis Date  . GERD (gastroesophageal reflux disease)   . Pruritus 04/07/2015  . Anxiety and depression 04/07/2015  . Genital HSV   . VIN III (vulvar intraepithelial neoplasia III)     Family Hx:  Family History  Problem Relation Age of Onset  . Hypertension Mother   . Heart disease Father     smoker  . Dementia Maternal Grandmother   . Heart disease Maternal Grandmother     s/p MI  . COPD Maternal Grandfather     smoker  . Cancer Maternal Grandfather     intestinal  . Colon cancer Neg Hx   . Colon polyps Neg Hx   . Diabetes Neg Hx     Vitals:  Blood pressure 116/73, pulse 74, temperature 98.1 F (36.7 C), temperature source Oral, resp. rate 18, height 5' 1.5" (1.562 m), weight 166 lb 8 oz (75.524 kg), SpO2 100 %.  Physical Exam:  General: Well developed, well nourished female in no acute distress. Alert and oriented x 3.  Cardiovascular: Regular rate and rhythm. S1 and S2 normal.  Lungs: Clear to auscultation bilaterally. No wheezes/crackles/rhonchi noted.  Skin: No rashes or lesions present. Back: No CVA tenderness.  Genitourinary:    Vulva/vagina: resolution of candida. Ulcerated  area at perineum but only involving mucosa. No new lesions      Urethra: No lesions or masses.  Extremities: No bilateral cyanosis or clubbing. Mild bilateral non-pitting edema noted.   Assessment/Plan:  44 year old s/p partial simple posterior vulvectomy on 03/26/15 with wound separation for VIN III. Healing slowly but well. Plan to see her in February 2017 for surveillance of VIN. She will followup with Dr Henderson Cloud for general gyn care.     Quinn Axe, MD 06/15/2015, 12:26 PM

## 2015-06-22 ENCOUNTER — Telehealth: Payer: Self-pay | Admitting: *Deleted

## 2015-06-22 NOTE — Telephone Encounter (Signed)
Received call from patient stating she has a new lesion close to her anal area that is "itchy and bothersome." Patient extremely anxious and requesting to see by seen by Dr. Andrey Farmerossi. Patient scheduled to see Dr. Andrey Farmerossi 06/26/15 at 9:45am. Patient agreeable to appt as scheduled.

## 2015-06-26 ENCOUNTER — Ambulatory Visit: Payer: 59 | Attending: Gynecologic Oncology | Admitting: Gynecologic Oncology

## 2015-06-26 ENCOUNTER — Encounter: Payer: Self-pay | Admitting: Gynecologic Oncology

## 2015-06-26 VITALS — BP 98/64 | HR 78 | Temp 98.1°F | Resp 18 | Ht 61.5 in | Wt 166.7 lb

## 2015-06-26 DIAGNOSIS — D071 Carcinoma in situ of vulva: Secondary | ICD-10-CM | POA: Insufficient documentation

## 2015-06-26 DIAGNOSIS — L292 Pruritus vulvae: Secondary | ICD-10-CM

## 2015-06-26 NOTE — Patient Instructions (Signed)
Try using Monistat cream over the counter to the area on your vulva that is itching.  Monitor your vulva once a month using a hand mirror and call Dr. Henderson Cloudomblin or Andrey Farmerossi with any new lesions.  Please call for any questions or concerns.

## 2015-06-26 NOTE — Progress Notes (Signed)
Follow Up Note: Gyn-Onc  [name] 44 y.o. female  CC:  Chief Complaint  Patient presents with  . 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 year old [id number] initially seen in consultation on 04/02/15 referred by Dr Henderson Cloud for [facility]. She then underwent a simple partial vuvlectomy of the perineum and posterior left vulva on 04/15/15 without complications. Her postoperative course was uncomplicated. 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  Interval History:  She presents today with persistent anterior vulvar prutitis. She had been evaluated twice in October and had been diagnosed with vulvo-vaginal candida. She has been treated with Diflucan.   She reports significant anxiety about persistent (4 years) of vulvar pruritis anteriorally that was present at the time of her diagnosis of VIN3. She therefore feels that it is masking her ability to know if she has VIN and is very anxious about this.  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  Review of Systems  Constitutional: Feels well.  No fever, chills, early satiety.  Cardiovascular: No chest pain, shortness of breath, or edema.  Pulmonary: No cough or wheeze.  Gastrointestinal: No nausea, vomiting, or diarrhea. No bright red blood per rectum or change in bowel movement.  Genitourinary:see HPI Musculoskeletal: No myalgia or joint pain. Neurologic: No weakness, numbness, or change in gait.  Psychology: No depression, anxiety, or insomnia  Current Meds:  Outpatient Encounter Prescriptions as of 06/26/2015  Medication Sig  . Ascorbic Acid (VITAMIN C) 1000 MG tablet Take 1,000 mg by mouth daily.  . cholecalciferol (VITAMIN D) 1000 UNITS tablet Take 1,000 Units by mouth daily.  . ferrous fumarate (HEMOCYTE - 106 MG FE) 325 (106 FE) MG TABS tablet Take 1 tablet by mouth daily.   . magnesium 30 MG tablet Take 30 mg by mouth daily.  . Multiple Vitamins-Minerals (MULTIVITAMIN ADULT PO) Take 1 tablet by mouth daily. Pt takes One-A-Day brand  . Pyridoxine HCl (VITAMIN B-6) 250 MG tablet Take 250 mg by mouth daily.  . valACYclovir (VALTREX) 1000 MG tablet Take 1 tablet (1,000 mg total) by mouth daily. (Patient taking differently: Take 1,000 mg by mouth every morning. )  . ALPRAZolam (XANAX) 0.25 MG tablet Take 1 tablet (0.25 mg total) by mouth at bedtime as needed for anxiety. (Patient not taking: Reported on 06/26/2015)  . cetirizine (ZYRTEC) 10 MG tablet Take 10 mg by mouth every morning.   . escitalopram (LEXAPRO) 10 MG tablet Take 1 tablet (10 mg total) by mouth daily. (Patient not taking: Reported on 06/26/2015)  . omeprazole (PRILOSEC) 40 MG capsule Take 40 mg by mouth every morning.   . [DISCONTINUED] vitamin B-12 (CYANOCOBALAMIN) 1000 MCG tablet Take 1,000 mcg by mouth daily.   No facility-administered encounter medications on file as of 06/26/2015.    Allergy:  Allergies  Allergen Reactions  . Amoxicillin-Pot Clavulanate Rash  . Famvir [Famciclovir] Photosensitivity  . Penicillins Rash    Right forearm erythema on volar aspect only    Social Hx:   Social History   Social History  . Marital Status: Married    Spouse Name: N/A  . Number of Children: 4  . Years of Education: N/A   Occupational History  . 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 Smoking status: Never Smoker   . Smokeless tobacco: Never Used  .  Alcohol Use: No  . Drug Use: No  . Sexual Activity:    Partners: Male    Birth Control/ Protection: Surgical   Other Topics Concern  . Not on file   Social History Narrative   Lives with husband and 4 children in a 3 story home.  Does not work.  Education: college    Past Surgical Hx:  Past Surgical History  Procedure Laterality Date  . Cesarean section  x4  last one 10-14-2010    w/ Bilateral Tubal Ligation  . Inner ear  surgery  1997  . Appendectomy  1997  . Esophagogastroduodenoscopy  01-22-2015  . Dilation and evacuation  x2  . Vulvectomy N/A 04/21/2015    Procedure: WIDE EXCISION OF VULVAR;  Surgeon: Adolphus BirchwoodEmma Sahas Sluka, MD;  [facility] [place];  Service: Gynecology;  Laterality: N/A;    Past Medical Hx:  Past Medical History  Diagnosis Date  . GERD (gastroesophageal reflux disease)   . Pruritus 04/07/2015  . Anxiety and depression 04/07/2015  . Genital HSV   . VIN III (vulvar intraepithelial neoplasia III)     Family Hx:  Family History  Problem Relation Age of Onset  . Hypertension Mother   . Heart disease Father     smoker  . Dementia Maternal Grandmother   . Heart disease Maternal Grandmother     s/p MI  . COPD Maternal Grandfather     smoker  . Cancer Maternal Grandfather     intestinal  . Colon cancer Neg Hx   . Colon polyps Neg Hx   . Diabetes Neg Hx     Vitals:  Blood pressure 98/64, pulse 78, temperature 98.1 F (36.7 C), temperature source Oral, resp. rate 18, height 5' 1.5" (1.562 m), weight 166 lb 11.2 oz (75.615 kg), SpO2 100 %.  Physical Exam:  General: Well developed, well nourished female in no acute distress. Alert and oriented x 3.  Cardiovascular: Regular rate and rhythm. S1 and S2 normal.  Lungs: Clear to auscultation bilaterally. No wheezes/crackles/rhonchi noted.  Skin: No rashes or lesions present. Back: No CVA tenderness.  Genitourinary:    Vulva/vagina: resolution of candida.  Ulcerated area at perineum but only involving mucosa. No new lesions. 4% acetic acid was applied to the entire vulva. No areas of abnromal vasculature or acetowhite changes were appeciated.     Urethra: No lesions or masses.  Extremities: No bilateral cyanosis or clubbing. Mild bilateral non-pitting edema noted.   Assessment/Plan:  44 year old s/p partial simple posterior vulvectomy on 03/26/15 with wound separation for VIN III. Healing slowly but well.  Anxious about  persistent (4 year) anterior vulvar itch. No evidence for dysplasia. Offered random biopsy of the location to histologically confirm negative pathology. Patient declined. Recommended trial of topical monistat. Patient has been diagnosed by vul[facility]odynia with [facility] in the past and declined treatment with tricyclic antidepressant.   She is highly anxious about "not knowing" if she has VIN recurrence as she did not know about her last lesion. She is extremely anxious about this. I recommended monthly vulvar inspections with a hand mirror.  Plan to see her in February 2017 for surveillance of VIN. She will followup with Dr Henderson Cloudomblin for general gyn care.     Quinn Axeossi, Kalicia Dufresne Caroline, MD 06/26/2015, 10:31 AM

## 2015-07-28 ENCOUNTER — Encounter: Payer: Self-pay | Admitting: Family Medicine

## 2015-07-28 ENCOUNTER — Ambulatory Visit (INDEPENDENT_AMBULATORY_CARE_PROVIDER_SITE_OTHER): Payer: 59 | Admitting: Family Medicine

## 2015-07-28 VITALS — BP 113/56 | HR 71 | Temp 98.5°F | Ht 62.0 in | Wt 169.1 lb

## 2015-07-28 DIAGNOSIS — M2550 Pain in unspecified joint: Secondary | ICD-10-CM | POA: Diagnosis not present

## 2015-07-28 DIAGNOSIS — R350 Frequency of micturition: Secondary | ICD-10-CM | POA: Diagnosis not present

## 2015-07-28 DIAGNOSIS — E663 Overweight: Secondary | ICD-10-CM

## 2015-07-28 DIAGNOSIS — IMO0001 Reserved for inherently not codable concepts without codable children: Secondary | ICD-10-CM

## 2015-07-28 DIAGNOSIS — M545 Low back pain, unspecified: Secondary | ICD-10-CM

## 2015-07-28 DIAGNOSIS — M7918 Myalgia, other site: Secondary | ICD-10-CM

## 2015-07-28 DIAGNOSIS — R635 Abnormal weight gain: Secondary | ICD-10-CM | POA: Diagnosis not present

## 2015-07-28 DIAGNOSIS — M791 Myalgia: Secondary | ICD-10-CM | POA: Diagnosis not present

## 2015-07-28 DIAGNOSIS — Z23 Encounter for immunization: Secondary | ICD-10-CM | POA: Diagnosis not present

## 2015-07-28 DIAGNOSIS — M609 Myositis, unspecified: Secondary | ICD-10-CM

## 2015-07-28 LAB — CBC WITH DIFFERENTIAL/PLATELET
BASOS ABS: 0 10*3/uL (ref 0.0–0.1)
Basophils Relative: 0.5 % (ref 0.0–3.0)
EOS ABS: 0.2 10*3/uL (ref 0.0–0.7)
Eosinophils Relative: 2.7 % (ref 0.0–5.0)
HCT: 42.1 % (ref 36.0–46.0)
Hemoglobin: 13.9 g/dL (ref 12.0–15.0)
LYMPHS PCT: 32.2 % (ref 12.0–46.0)
Lymphs Abs: 2.4 10*3/uL (ref 0.7–4.0)
MCHC: 33 g/dL (ref 30.0–36.0)
MCV: 96.6 fl (ref 78.0–100.0)
MONOS PCT: 6.5 % (ref 3.0–12.0)
Monocytes Absolute: 0.5 10*3/uL (ref 0.1–1.0)
NEUTROS ABS: 4.3 10*3/uL (ref 1.4–7.7)
NEUTROS PCT: 58.1 % (ref 43.0–77.0)
PLATELETS: 221 10*3/uL (ref 150.0–400.0)
RBC: 4.36 Mil/uL (ref 3.87–5.11)
RDW: 13.3 % (ref 11.5–15.5)
WBC: 7.4 10*3/uL (ref 4.0–10.5)

## 2015-07-28 LAB — RHEUMATOID FACTOR

## 2015-07-28 LAB — TSH: TSH: 2.44 u[IU]/mL (ref 0.35–4.50)

## 2015-07-28 LAB — SEDIMENTATION RATE: Sed Rate: 16 mm/hr (ref 0–22)

## 2015-07-28 LAB — T3, FREE: T3, Free: 3.7 pg/mL (ref 2.3–4.2)

## 2015-07-28 LAB — T4, FREE: FREE T4: 0.72 ng/dL (ref 0.60–1.60)

## 2015-07-28 MED ORDER — VALACYCLOVIR HCL 1 G PO TABS
1000.0000 mg | ORAL_TABLET | Freq: Every day | ORAL | Status: DC
Start: 2015-07-28 — End: 2016-12-20

## 2015-07-28 NOTE — Progress Notes (Signed)
Pre visit review using our clinic review tool, if applicable. No additional management support is needed unless otherwise documented below in the visit note. 

## 2015-07-28 NOTE — Patient Instructions (Addendum)
Curcumen caps/turmeric daily, NOW company, luckyvitamins.com   DASH Eating Plan DASH stands for "Dietary Approaches to Stop Hypertension." The DASH eating plan is a healthy eating plan that has been shown to reduce high blood pressure (hypertension). Additional health benefits may include reducing the risk of type 2 diabetes mellitus, heart disease, and stroke. The DASH eating plan may also help with weight loss. WHAT DO I NEED TO KNOW ABOUT THE DASH EATING PLAN? For the DASH eating plan, you will follow these general guidelines:  Choose foods with a percent daily value for sodium of less than 5% (as listed on the food label).  Use salt-free seasonings or herbs instead of table salt or sea salt.  Check with your health care provider or pharmacist before using salt substitutes.  Eat lower-sodium products, often labeled as "lower sodium" or "no salt added."  Eat fresh foods.  Eat more vegetables, fruits, and low-fat dairy products.  Choose whole grains. Look for the word "whole" as the first word in the ingredient list.  Choose fish and skinless chicken or Malaw[place]urkey more often than red meat. Limit fish, poultry, and meat to 6 oz (170 g) each day.  Limit sweets, desserts, sugars, and sugary drinks.  Choose heart-healthy fats.  Limit cheese to 1 oz (28 g) per day.  Eat more home-cooked food and less restaurant, buffet, and fast food.  Limit fried foods.  Cook foods using methods other than frying.  Limit canned vegetables. If you do use them, rinse them well to decrease the sodium.  When eating at a restaurant, ask that your food be prepared with less salt, or no salt if possible. WHAT FOODS CAN I EAT? Seek help from a dietitian for individual calorie needs. Grains Whole grain or whole wheat bread. Brown rice. Whole grain or whole wheat pasta. Quinoa, bulgur, and whole grain cereals. Low-sodium cereals. Corn or whole wheat flour tortillas. Whole grain cornbread. Whole grain  crackers. Low-sodium crackers. Vegetables Fresh or frozen vegetables (raw, steamed, roasted, or grilled). Low-sodium or reduced-sodium tomato and vegetable juices. Low-sodium or reduced-sodium tomato sauce and paste. Low-sodium or reduced-sodium canned vegetables.  Fruits All fresh, canned (in natural juice), or frozen fruits. Meat and Other Protein Products Ground beef (85% or leaner), grass-fed beef, or beef trimmed of fat. Skinless chicken or Mal[place]urkey. Ground chicken or [place]urkey. Pork trimmed of fat. All fish and seafood. Eggs. Dried beans, peas, or lentils. Unsalted nuts and seeds. Unsalted canned beans. Dairy Low-fat dairy products, such as skim or 1% milk, 2% or reduced-fat cheeses, low-fat ricotta or cottage cheese, or plain low-fat yogurt. Low-sodium or reduced-sodium cheeses. Fats and Oils Tub margarines without trans fats. Light or reduced-fat mayonnaise and salad dressings (reduced sodium). Avocado. Safflower, olive, or canola oils. Natural peanut or almond butter. Other Unsalted popcorn and pretzels. The items listed above may not be a complete list of recommended foods or beverages. Contact your dietitian for more options. WHAT FOODS ARE NOT RECOMMENDED? Grains White bread. White pasta. White rice. Refined cornbread. Bagels and croissants. Crackers that contain trans fat. Vegetables Creamed or fried vegetables. Vegetables in a cheese sauce. Regular canned vegetables. Regular canned tomato sauce and paste. Regular tomato and vegetable juices. Fruits Dried fruits. Canned fruit in light or heavy syrup. Fruit juice. Meat and Other Protein Products Fatty cuts of meat. Ribs, chicken wings, bacon, sausage, bologna, salami, chitterlings, fatback, hot dogs, bratwurst, and packaged luncheon meats. Salted nuts and seeds. Canned beans with salt. Dairy Whole or 2% milk, cream, half-and-half,  and cream cheese. Whole-fat or sweetened yogurt. Full-fat cheeses or blue cheese. Nondairy creamers and  whipped toppings. Processed cheese, cheese spreads, or cheese curds. Condiments Onion and garlic salt, seasoned salt, table salt, and sea salt. Canned and packaged gravies. Worcestershire sauce. Tartar sauce. Barbecue sauce. Teriyaki sauce. Soy sauce, including reduced sodium. Steak sauce. Fish sauce. Oyster sauce. Cocktail sauce. Horseradish. Ketchup and mustard. Meat flavorings and tenderizers. Bouillon cubes. Hot sauce. Tabasco sauce. Marinades. Taco seasonings. Relishes. Fats and Oils Butter, stick margarine, lard, shortening, ghee, and bacon fat. Coconut, palm kernel, or palm oils. Regular salad dressings. Other Pickles and olives. Salted popcorn and pretzels. The items listed above may not be a complete list of foods and beverages to avoid. Contact your dietitian for more information. WHERE CAN I FIND MORE INFORMATION? National Heart, Lung, and Blood Institute: [employer]   This information is not intended to replace advice given to you by your health care provider. Make sure you discuss any questions you have with your health care provider.   Document Released: 07/28/2011 Document Revised: 08/29/2014 Document Reviewed: 06/12/2013 Elsevier Interactive Patient Education [employer]

## 2015-07-29 ENCOUNTER — Other Ambulatory Visit: Payer: Self-pay | Admitting: Medical

## 2015-07-29 LAB — ANA: ANA: NEGATIVE

## 2015-08-01 NOTE — Assessment & Plan Note (Signed)
Recent worsening of weight gain, no obvious cause with labs. Encouraged DASH diet, decrease po intake and increase exercise as tolerated. Needs 7-8 hours of sleep nightly. Avoid trans fats, eat small, frequent meals every 4-5 hours with lean proteins, complex carbs and healthy fats. Minimize simple carbs, GMO foods.

## 2015-08-01 NOTE — Assessment & Plan Note (Signed)
Patient notes recent increase in joint pain and swelling especially in fingers. No warmth or redness.

## 2015-08-01 NOTE — Progress Notes (Signed)
Subjective:    Patient ID: [name], female    DOB: 1971/04/30, 44 y.o.   MRN: [id number]  Chief Complaint  Patient presents with  . Follow-up    HPI Patient is in today for evaluation of weight gain, fatigue and joint pain. She notes recently she's been having more trouble with swelling in her fingers and pain in her fingers, shoulders and knees. No recent injury. No recent fever or acute illness. She notes waking up stiff and swollen and painful it does improve throughout the day. She also notes more. Menstrual irritability and nausea. No irregular menses. Denies CP/palp/SOB/HA/congestion/fevers/GI or GU c/o. Taking meds as prescribed  Past Medical History  Diagnosis Date  . GERD (gastroesophageal reflux disease)   . Pruritus 04/07/2015  . Anxiety and depression 04/07/2015  . Genital HSV   . VIN III (vulvar intraepithelial neoplasia III)     Past Surgical History  Procedure Laterality Date  . Cesarean section  x4  last one 10-14-2010    w/ Bilateral Tubal Ligation  . Inner ear surgery  1997  . Appendectomy  1997  . Esophagogastroduodenoscopy  01-22-2015  . Dilation and evacuation  x2  . Vulvectomy N/A 04/21/2015    Procedure: WIDE EXCISION OF VULVAR;  Surgeon: Everitt Amber, MD;  [facility];  Service: Gynecology;  Laterality: N/A;    Family History  Problem Relation Age of Onset  . Hypertension Mother   . Heart disease Father     smoker  . Dementia Maternal Grandmother   . Heart disease Maternal Grandmother     s/p MI  . COPD Maternal Grandfather     smoker  . Cancer Maternal Grandfather     intestinal  . Colon cancer Neg Hx   . Colon polyps Neg Hx   . Diabetes Neg Hx     Social History   Social History  . Marital Status: Married    Spouse Name: N/A  . Number of Children: 4  . Years of Education: N/A   Occupational History  . 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 Smoking status: Never Smoker   . Smokeless  tobacco: Never Used  . Alcohol Use: No  . Drug Use: No  . Sexual Activity:    Partners: Male    Birth Control/ Protection: Surgical   Other Topics Concern  . Not on file   Social History Narrative   Lives with husband and 4 children in a 3 story home.  Does not work.  Education: college    Outpatient Prescriptions Prior to Visit  Medication Sig Dispense Refill  . Ascorbic Acid (VITAMIN C) 1000 MG tablet Take 1,000 mg by mouth daily.    . cholecalciferol (VITAMIN D) 1000 UNITS tablet Take 1,000 Units by mouth daily.    . ferrous fumarate (HEMOCYTE - 106 MG FE) 325 (106 FE) MG TABS tablet Take 1 tablet by mouth daily.    . magnesium 30 MG tablet Take 30 mg by mouth daily.    . Multiple Vitamins-Minerals (MULTIVITAMIN ADULT PO) Take 1 tablet by mouth daily. Pt takes One-A-Day brand    . Pyridoxine HCl (VITAMIN B-6) 250 MG tablet Take 250 mg by mouth daily.    . cetirizine (ZYRTEC) 10 MG tablet Take 10 mg by mouth every morning.     . escitalopram (LEXAPRO) 10 MG tablet Take 1 tablet (10 mg total) by mouth daily. (Patient not taking: Reported on 06/26/2015) 30 tablet 5  .  ALPRAZolam (XANAX) 0.25 MG tablet Take 1 tablet (0.25 mg total) by mouth at bedtime as needed for anxiety. (Patient not taking: Reported on 06/26/2015) 10 tablet 1  . omeprazole (PRILOSEC) 40 MG capsule Take 40 mg by mouth every morning.     . valACYclovir (VALTREX) 1000 MG tablet Take 1 tablet (1,000 mg total) by mouth daily. (Patient not taking: Reported on 07/28/2015) 30 tablet 5   No facility-administered medications prior to visit.    Allergies  Allergen Reactions  . Amoxicillin-Pot Clavulanate Rash  . Famvir [Famciclovir] Photosensitivity  . Penicillins Rash    Right forearm erythema on volar aspect only    Review of Systems  Constitutional: Positive for malaise/fatigue. Negative for fever.  HENT: Negative for congestion.   Eyes: Negative for discharge.  Respiratory: Negative for shortness of breath.     Cardiovascular: Negative for chest pain, palpitations and leg swelling.  Gastrointestinal: Negative for nausea and abdominal pain.  Genitourinary: Negative for dysuria.  Musculoskeletal: Positive for joint pain. Negative for falls.  Skin: Negative for rash.  Neurological: Negative for loss of consciousness and headaches.  Endo/Heme/Allergies: Negative for environmental allergies.  Psychiatric/Behavioral: Negative for depression. The patient is nervous/anxious.        Objective:    Physical Exam  Constitutional: She is oriented to person, place, and time. She appears well-developed and well-nourished. No distress.  HENT:  Head: Normocephalic and atraumatic.  Nose: Nose normal.  Eyes: Right eye exhibits no discharge. Left eye exhibits no discharge.  Neck: Normal range of motion. Neck supple.  Cardiovascular: Normal rate and regular rhythm.   No murmur heard. Pulmonary/Chest: Effort normal and breath sounds normal.  Abdominal: Soft. Bowel sounds are normal. There is no tenderness.  Musculoskeletal: She exhibits no edema.  Neurological: She is alert and oriented to person, place, and time.  Skin: Skin is warm and dry.  Psychiatric: She has a normal mood and affect.  Nursing note and vitals reviewed.   BP 113/56 mmHg  Pulse 71  Temp(Src) 98.5 F (36.9 C) (Oral)  Ht _0  (1.575 m)  Wt 169 lb 2 oz (76.715 kg)  BMI 30.93 kg/m2  SpO2 100% Wt Readings from Last 3 Encounters:  07/28/15 169 lb 2 oz (76.715 kg)  06/26/15 166 lb 11.2 oz (75.615 kg)  06/15/15 166 lb 8 oz (75.524 kg)     Lab Results  Component Value Date   WBC 7.4 07/28/2015   HGB 13.9 07/28/2015   HCT 42.1 07/28/2015   PLT 221.0 07/28/2015   GLUCOSE 95 02/15/2015   CHOL 193 07/16/2013   TRIG 70 07/16/2013   HDL 49 07/16/2013   LDLCALC 130* 07/16/2013   ALT 13 07/16/2013   AST 16 07/16/2013   NA 136 02/15/2015   K 4.3 02/15/2015   CL 103 02/15/2015   CREATININE 0.71 02/15/2015   BUN 20 02/15/2015    CO2 26 02/15/2015   TSH 2.44 07/28/2015    Lab Results  Component Value Date   TSH 2.44 07/28/2015   Lab Results  Component Value Date   WBC 7.4 07/28/2015   HGB 13.9 07/28/2015   HCT 42.1 07/28/2015   MCV 96.6 07/28/2015   PLT 221.0 07/28/2015   Lab Results  Component Value Date   NA 136 02/15/2015   K 4.3 02/15/2015   CO2 26 02/15/2015   GLUCOSE 95 02/15/2015   BUN 20 02/15/2015   CREATININE 0.71 02/15/2015   BILITOT 0.3 07/16/2013   ALKPHOS 62 07/16/2013  AST 16 07/16/2013   ALT 13 07/16/2013   PROT 6.9 07/16/2013   ALBUMIN 4.3 07/16/2013   ALBUMIN 4.3 07/16/2013   CALCIUM 8.9 02/15/2015   ANIONGAP 7 02/15/2015   GFR 88.48 01/03/2011   Lab Results  Component Value Date   CHOL 193 07/16/2013   Lab Results  Component Value Date   HDL 49 07/16/2013   Lab Results  Component Value Date   LDLCALC 130* 07/16/2013   Lab Results  Component Value Date   TRIG 70 07/16/2013   Lab Results  Component Value Date   CHOLHDL 3.9 07/16/2013   No results found for: HGBA1C     Assessment & Plan:   Problem List Items Addressed This Visit    Musculoskeletal pain    Patient notes recent increase in joint pain and swelling especially in fingers. No warmth or redness.       Overweight    Recent worsening of weight gain, no obvious cause with labs. Encouraged DASH diet, decrease po intake and increase exercise as tolerated. Needs 7-8 hours of sleep nightly. Avoid trans fats, eat small, frequent meals every 4-5 hours with lean proteins, complex carbs and healthy fats. Minimize simple carbs, GMO foods.       Other Visit Diagnoses    Low back pain potentially associated with radiculopathy    -  Primary    Relevant Medications    valACYclovir (VALTREX) 1000 MG tablet    Other Relevant Orders    Rheumatoid Factor (Completed)    Antinuclear Antib (ANA) (Completed)    CBC w/Diff (Completed)    Sed Rate (ESR) (Completed)    TSH (Completed)    T4, free (Completed)     T3, free (Completed)    Urinary frequency        Relevant Medications    valACYclovir (VALTREX) 1000 MG tablet    Other Relevant Orders    Rheumatoid Factor (Completed)    Antinuclear Antib (ANA) (Completed)    CBC w/Diff (Completed)    Sed Rate (ESR) (Completed)    TSH (Completed)    T4, free (Completed)    T3, free (Completed)    Encounter for immunization        Weight gain        Relevant Orders    Rheumatoid Factor (Completed)    Antinuclear Antib (ANA) (Completed)    CBC w/Diff (Completed)    Sed Rate (ESR) (Completed)    TSH (Completed)    T4, free (Completed)    T3, free (Completed)    Arthralgia        Relevant Orders    Rheumatoid Factor (Completed)    Antinuclear Antib (ANA) (Completed)    CBC w/Diff (Completed)    Sed Rate (ESR) (Completed)    TSH (Completed)    T4, free (Completed)    T3, free (Completed)    Myalgia and myositis        Relevant Orders    Rheumatoid Factor (Completed)    Antinuclear Antib (ANA) (Completed)    CBC w/Diff (Completed)    Sed Rate (ESR) (Completed)    TSH (Completed)    T4, free (Completed)    T3, free (Completed)       I have discontinued Ms. Tomczak's omeprazole and ALPRAZolam. I am also having her maintain her cholecalciferol, vitamin C, Multiple Vitamins-Minerals (MULTIVITAMIN ADULT PO), cetirizine, ferrous fumarate, magnesium, escitalopram, vitamin B-6, and valACYclovir.  Meds ordered this encounter  Medications  . valACYclovir (VALTREX) 1000 MG  tablet    Sig: Take 1 tablet (1,000 mg total) by mouth daily.    Dispense:  30 tablet    Refill:  5     Penni Homans, MD

## 2015-08-13 ENCOUNTER — Ambulatory Visit (INDEPENDENT_AMBULATORY_CARE_PROVIDER_SITE_OTHER): Payer: 59 | Admitting: Medical

## 2015-08-13 ENCOUNTER — Other Ambulatory Visit: Payer: Self-pay | Admitting: Medical

## 2015-08-13 ENCOUNTER — Encounter: Payer: Self-pay | Admitting: Medical

## 2015-08-13 ENCOUNTER — Ambulatory Visit (HOSPITAL_BASED_OUTPATIENT_CLINIC_OR_DEPARTMENT_OTHER)
Admission: RE | Admit: 2015-08-13 | Discharge: 2015-08-13 | Disposition: A | Discharge status: Home / Self Care | Payer: 59 | Source: Ambulatory Visit | Attending: Medical | Admitting: Medical

## 2015-08-13 VITALS — BP 100/60 | HR 78 | Temp 98.0°F | Ht 62.0 in | Wt 167.4 lb

## 2015-08-13 DIAGNOSIS — M25562 Pain in left knee: Secondary | ICD-10-CM

## 2015-08-13 DIAGNOSIS — M542 Cervicalgia: Secondary | ICD-10-CM | POA: Diagnosis present

## 2015-08-13 DIAGNOSIS — M25561 Pain in right knee: Secondary | ICD-10-CM

## 2015-08-13 DIAGNOSIS — M545 Low back pain, unspecified: Secondary | ICD-10-CM

## 2015-08-13 DIAGNOSIS — M898X1 Other specified disorders of bone, shoulder: Secondary | ICD-10-CM | POA: Insufficient documentation

## 2015-08-13 DIAGNOSIS — M546 Pain in thoracic spine: Secondary | ICD-10-CM

## 2015-08-13 MED ORDER — KETOROLAC TROMETHAMINE 60 MG/2ML IM SOLN
60.0000 mg | Freq: Once | INTRAMUSCULAR | Status: AC
  Administered 2015-08-13: 60 mg via INTRAMUSCULAR

## 2015-08-13 MED ORDER — HYDROCODONE-ACETAMINOPHEN 5-325 MG PO TABS: 1.0000 | ORAL_TABLET | Freq: Four times a day (QID) | ORAL | Status: DC | PRN

## 2015-08-13 MED ORDER — CYCLOBENZAPRINE HCL 10 MG PO TABS: 10.0000 mg | ORAL_TABLET | Freq: Every day | ORAL | Status: DC

## 2015-08-13 MED ORDER — DICLOFENAC SODIUM 75 MG PO TBEC: 75.0000 mg | DELAYED_RELEASE_TABLET | Freq: Two times a day (BID) | ORAL | Status: DC

## 2015-08-13 NOTE — Patient Instructions (Signed)
For various areas of pain will go ahead and xray these areas since pain persist  Moderate-high level despite 2 wks since accident.  Toradol IM for pain given.  Start tomorrow diclofenac rx. Not otc nsaids while on. Rx cyclobenzaprine muscle relaxant. norco for severe pain.  Will follow xrays and see how you are doing. If radiating pain to upper arms persist may order cspine mri.  Follow up in 7-10 days or as needed

## 2015-08-13 NOTE — Progress Notes (Signed)
Pre visit review using our clinic review tool, if applicable. No additional management support is needed unless otherwise documented below in the visit note. 
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
## 2015-08-18 ENCOUNTER — Encounter: Payer: Self-pay | Admitting: Family Medicine

## 2015-08-18 ENCOUNTER — Emergency Department (HOSPITAL_COMMUNITY)
Admission: EM | Admit: 2015-08-18 | Discharge: 2015-08-18 | Disposition: A | Discharge status: Home / Self Care | Payer: 59 | Attending: Emergency Medicine | Admitting: Emergency Medicine

## 2015-08-18 ENCOUNTER — Telehealth: Payer: Self-pay | Admitting: Family Medicine

## 2015-08-18 ENCOUNTER — Encounter (HOSPITAL_COMMUNITY): Payer: Self-pay | Admitting: Vascular Surgery

## 2015-08-18 ENCOUNTER — Emergency Department (HOSPITAL_COMMUNITY): Payer: 59

## 2015-08-18 DIAGNOSIS — Z880 Allergy status to penicillin: Secondary | ICD-10-CM | POA: Diagnosis not present

## 2015-08-18 DIAGNOSIS — F419 Anxiety disorder, unspecified: Secondary | ICD-10-CM | POA: Diagnosis not present

## 2015-08-18 DIAGNOSIS — F329 Major depressive disorder, single episode, unspecified: Secondary | ICD-10-CM | POA: Diagnosis not present

## 2015-08-18 DIAGNOSIS — M542 Cervicalgia: Secondary | ICD-10-CM | POA: Diagnosis present

## 2015-08-18 DIAGNOSIS — R202 Paresthesia of skin: Secondary | ICD-10-CM | POA: Diagnosis not present

## 2015-08-18 DIAGNOSIS — M545 Low back pain: Secondary | ICD-10-CM | POA: Diagnosis not present

## 2015-08-18 DIAGNOSIS — R51 Headache: Secondary | ICD-10-CM | POA: Diagnosis not present

## 2015-08-18 DIAGNOSIS — Z8619 Personal history of other infectious and parasitic diseases: Secondary | ICD-10-CM | POA: Insufficient documentation

## 2015-08-18 DIAGNOSIS — Z8719 Personal history of other diseases of the digestive system: Secondary | ICD-10-CM | POA: Diagnosis not present

## 2015-08-18 MED ORDER — METHOCARBAMOL 500 MG PO TABS: 500.0000 mg | ORAL_TABLET | Freq: Four times a day (QID) | ORAL | Status: DC | PRN

## 2015-08-18 MED ORDER — MELOXICAM 7.5 MG PO TABS: 7.5000 mg | ORAL_TABLET | Freq: Every day | ORAL | Status: DC | PRN
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
## 2015-08-18 NOTE — Discharge Instructions (Signed)
Read the information below.  Use the prescribed medication as directed.  Please discuss all new medications with your pharmacist.  You may return to the Emergency Department at any time for worsening condition or any new symptoms that concern you.   If you develop fevers, loss of control of bowel or bladder, worsening weakness or numbness in your arms or legs, or are unable to walk, return to the ER for a recheck.    Paresthesia Paresthesia is an abnormal burning or prickling sensation. This sensation is generally felt in the hands, arms, legs, or feet. However, it may occur in any part of the body. Usually, it is not painful. The feeling may be described as:  Tingling or numbness.  Pins and needles.  Skin crawling.  Buzzing.  Limbs falling asleep.  Itching. Most people experience temporary (transient) paresthesia at some time in their lives. Paresthesia may occur when you breathe too quickly (hyperventilation). It can also occur without any apparent cause. Commonly, paresthesia occurs when pressure is placed on a nerve. The sensation quickly goes away after the pressure is removed. For some people, however, paresthesia is a long-lasting (chronic) condition that is caused by an underlying disorder. If you continue to have paresthesia, you may need further medical evaluation. HOME CARE INSTRUCTIONS Watch your condition for any changes. Taking the following actions may help to lessen any discomfort that you are feeling:  Avoid drinking alcohol.  Try acupuncture or massage to help relieve your symptoms.  Keep all follow-up visits as directed by your health care provider. This is important. SEEK MEDICAL CARE IF:  You continue to have episodes of paresthesia.  Your burning or prickling feeling gets worse when you walk.  You have pain, cramps, or dizziness.  You develop a rash. SEEK IMMEDIATE MEDICAL CARE IF:  You feel weak.  You have trouble walking or moving.  You have problems  with speech, understanding, or vision.  You feel confused.  You cannot control your bladder or bowel movements.  You have numbness after an injury.  You faint.   This information is not intended to replace advice given to you by your health care provider. Make sure you discuss any questions you have with your health care provider.   Document Released: 07/29/2002 Document Revised: 12/23/2014 Document Reviewed: 08/04/2014 Elsevier Interactive Patient Education 2016 [employer]  Musculoskeletal Pain Musculoskeletal pain is muscle and boney aches and pains. These pains can occur in any part of the body. Your caregiver may treat you without knowing the cause of the pain. They may treat you if blood or urine tests, X-rays, and other tests were normal.  CAUSES There is often not a definite cause or reason for these pains. These pains may be caused by a type of germ (virus). The discomfort may also come from overuse. Overuse includes working out too hard when your body is not fit. Boney aches also come from weather changes. Bone is sensitive to atmospheric pressure changes. HOME CARE INSTRUCTIONS   Ask when your test results will be ready. Make sure you get your test results.  Only take over-the-counter or prescription medicines for pain, discomfort, or fever as directed by your caregiver. If you were given medications for your condition, do not drive, operate machinery or power tools, or sign legal documents for 24 hours. Do not drink alcohol. Do not take sleeping pills or other medications that may interfere with treatment.  Continue all activities unless the activities cause more pain. When the pain lessens,  slowly resume normal activities. Gradually increase the intensity and duration of the activities or exercise.  During periods of severe pain, bed rest may be helpful. Lay or sit in any position that is comfortable.  Putting ice on the injured area.  Put ice in a bag.  Place a towel  between your skin and the bag.  Leave the ice on for 15 to 20 minutes, 3 to 4 times a day.  Follow up with your caregiver for continued problems and no reason can be found for the pain. If the pain becomes worse or does not go away, it may be necessary to repeat tests or do additional testing. Your caregiver may need to look further for a possible cause. SEEK IMMEDIATE MEDICAL CARE IF:  You have pain that is getting worse and is not relieved by medications.  You develop chest pain that is associated with shortness or breath, sweating, feeling sick to your stomach (nauseous), or throw up (vomit).  Your pain becomes localized to the abdomen.  You develop any new symptoms that seem different or that concern you. MAKE SURE YOU:   Understand these instructions.  Will watch your condition.  Will get help right away if you are not doing well or get worse.   This information is not intended to replace advice given to you by your health care provider. Make sure you discuss any questions you have with your health care provider.   Document Released: 08/08/2005 Document Revised: 10/31/2011 Document Reviewed: 04/12/2013 Elsevier Interactive Patient Education [employer]

## 2015-08-25 ENCOUNTER — Ambulatory Visit: Payer: 59 | Admitting: Medical

## 2015-08-26 ENCOUNTER — Ambulatory Visit: Payer: 59 | Admitting: Medical

## 2015-10-05 ENCOUNTER — Ambulatory Visit: Payer: 59 | Attending: Gynecologic Oncology | Admitting: Gynecologic Oncology

## 2015-10-05 ENCOUNTER — Encounter: Payer: Self-pay | Admitting: Gynecologic Oncology

## 2015-10-05 VITALS — BP 125/85 | HR 90 | Temp 97.7°F | Resp 18 | Wt 172.3 lb

## 2015-10-05 DIAGNOSIS — L292 Pruritus vulvae: Secondary | ICD-10-CM | POA: Diagnosis not present

## 2015-10-05 DIAGNOSIS — F329 Major depressive disorder, single episode, unspecified: Secondary | ICD-10-CM | POA: Insufficient documentation

## 2015-10-05 DIAGNOSIS — D071 Carcinoma in situ of vulva: Secondary | ICD-10-CM | POA: Insufficient documentation

## 2015-10-05 DIAGNOSIS — K219 Gastro-esophageal reflux disease without esophagitis: Secondary | ICD-10-CM | POA: Diagnosis not present

## 2015-10-05 DIAGNOSIS — A6000 Herpesviral infection of urogenital system, unspecified: Secondary | ICD-10-CM | POA: Diagnosis not present

## 2015-10-05 DIAGNOSIS — Z880 Allergy status to penicillin: Secondary | ICD-10-CM | POA: Insufficient documentation

## 2015-10-05 DIAGNOSIS — F419 Anxiety disorder, unspecified: Secondary | ICD-10-CM | POA: Insufficient documentation

## 2015-10-05 DIAGNOSIS — Z9889 Other specified postprocedural states: Secondary | ICD-10-CM | POA: Diagnosis present

## 2015-10-05 DIAGNOSIS — Z8249 Family history of ischemic heart disease and other diseases of the circulatory system: Secondary | ICD-10-CM | POA: Diagnosis not present

## 2015-10-05 NOTE — Progress Notes (Signed)
Follow Up Note: Gyn-Onc  [name] 45 y.o. female  CC:  Chief Complaint  Patient presents with  . VIN III Vulvar Intraepithelial neoplasia    MD follow up    HPI:  [name] is a 45 y.o. year old [id number] initially seen in consultation on 04/02/15 referred by Dr Henderson Cloud for [facility]. She then underwent a simple partial vuvlectomy of the perineum and posterior left vulva on 04/15/15 without complications. Her postoperative course was uncomplicated. 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  Interval History:  She presents today for scheduled follow-up.  She has persistent anterior vulvar prutitis. She had been evaluated twice in October 2016 and had been diagnosed with vulvo-vaginal candida.   She reports significant anxiety about persistent (4 years) of vulvar pruritis anteriorally that was present at the time of her diagnosis of VIN3. She therefore feels that it is masking her ability to know if she has VIN and is very anxious about this.  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  Review of Systems  Constitutional: Feels well.  No fever, chills, early satiety.  Cardiovascular: No chest pain, shortness of breath, or edema.  Pulmonary: No cough or wheeze.  Gastrointestinal: No nausea, vomiting, or diarrhea. No bright red blood per rectum or change in bowel movement.  Genitourinary:see HPI Musculoskeletal: No myalgia or joint pain. Neurologic: No weakness, numbness, or change in gait.  Psychology: No depression, anxiety, or insomnia  Current Meds:  Outpatient Encounter Prescriptions as of 10/05/2015  Medication Sig  . Multiple Vitamins-Minerals (WOMENS MULTIVITAMIN PLUS PO) Take 1 tablet by mouth daily.  Marland Kitchen escitalopram (LEXAPRO) 10 MG tablet Take 1 tablet (10 mg total) by mouth daily. (Patient not taking: Reported on 08/18/2015)  . gabapentin (NEURONTIN) 100 MG capsule TAKE 1  CAPSULE BY MOUTH 3 TIMES DAILY (Patient not taking: Reported on 08/18/2015)  . meloxicam (MOBIC) 7.5 MG tablet Take 1 tablet (7.5 mg total) by mouth daily as needed for pain. (Patient not taking: Reported on 10/05/2015)  . methocarbamol (ROBAXIN) 500 MG tablet Take 1-2 tablets (500-1,000 mg total) by mouth every 6 (six) hours as needed. (Patient not taking: Reported on 10/05/2015)  . valACYclovir (VALTREX) 1000 MG tablet Take 1 tablet (1,000 mg total) by mouth daily. (Patient not taking: Reported on 08/18/2015)  . [DISCONTINUED] HYDROcodone-acetaminophen (NORCO) 5-325 MG tablet Take 1 tablet by mouth every 6 (six) hours as needed for moderate pain. (Patient not taking: Reported on 08/18/2015)   No facility-administered encounter medications on file as of 10/05/2015.    Allergy:  Allergies  Allergen Reactions  . Amoxicillin-Pot Clavulanate Rash  . Famvir [Famciclovir] Photosensitivity  . Penicillins Rash    Right forearm erythema on volar aspect only Has patient had a PCN reaction causing immediate rash, facial/tongue/throat swelling, SOB or lightheadedness with hypotension:YES Has patient had a PCN reaction causing severe rash involving mucus membranes or skin necrosis: NO Has patient had a PCN reaction that required hospitalization NO Has patient had a PCN reaction occurring within the last 10 years: YES If all of the above answers are "NO", then may proceed with Cephalosporin use.    Social Hx:   Social History   Social History  . Marital Status: Married    Spouse Name: N/A  . Number of Children: 4  . Years of Education: N/A   Occupational History  . 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 Smoking status:  Never Smoker   . Smokeless tobacco: Never Used  . Alcohol Use: No  . Drug Use: No  . Sexual Activity:    Partners: Male    Birth Control/ Protection: Surgical   Other Topics Concern  . Not on file   Social History Narrative   Lives with husband and 4 children in a  3 story home.  Does not work.  Education: college    Past Surgical Hx:  Past Surgical History  Procedure Laterality Date  . Cesarean section  x4  last one 10-14-2010    w/ Bilateral Tubal Ligation  . Inner ear surgery  1997  . Appendectomy  1997  . Esophagogastroduodenoscopy  01-22-2015  . Dilation and evacuation  x2  . Vulvectomy N/A 04/21/2015    Procedure: WIDE EXCISION OF VULVAR;  Surgeon: Adolphus Birchwood, MD;  Location: [facility];  Service: Gynecology;  Laterality: N/A;    Past Medical Hx:  Past Medical History  Diagnosis Date  . GERD (gastroesophageal reflux disease)   . Pruritus 04/07/2015  . Anxiety and depression 04/07/2015  . Genital HSV   . VIN III (vulvar intraepithelial neoplasia III)     Family Hx:  Family History  Problem Relation Age of Onset  . Hypertension Mother   . Heart disease Father     smoker  . Dementia Maternal Grandmother   . Heart disease Maternal Grandmother     s/p MI  . COPD Maternal Grandfather     smoker  . Cancer Maternal Grandfather     intestinal  . Colon cancer Neg Hx   . Colon polyps Neg Hx   . Diabetes Neg Hx     Vitals:  Blood pressure 125/85, pulse 90, temperature 97.7 F (36.5 C), temperature source Oral, resp. rate 18, weight 172 lb 4.8 oz (78.155 kg), SpO2 100 %.  Physical Exam:  General: Well developed, well nourished female in no acute distress. Alert and oriented x 3.  Cardiovascular: Regular rate and rhythm. S1 and S2 normal.  Lungs: Clear to auscultation bilaterally. No wheezes/crackles/rhonchi noted.  Skin: No rashes or lesions present. Back: No CVA tenderness.  Genitourinary:    Vulva/vagina: resolution of candida.  No new lesions. 4% acetic acid was applied to the entire vulva. No areas of abnromal vasculature or acetowhite changes were appeciated.     Urethra: No lesions or masses.  Extremities: No bilateral cyanosis or clubbing. Mild bilateral non-pitting edema noted.   Assessment/Plan:  45 year  old s/p partial simple posterior vulvectomy on 03/26/15 with wound separation for VIN III. Healing slowly but well.  Anxious about persistent (4 year) anterior vulvar itch. No evidence for dysplasia. Offered random biopsy of the location to histologically confirm negative pathology. Patient declined. Patient has been diagnosed by vulvodynia with Dr Orion Crook at [facility] in the past and declined treatment with tricyclic antidepressant.   Plan to see her in August 2017 for surveillance of VIN. She will followup with Dr Henderson Cloud for general gyn care.     Quinn Axe, MD 10/05/2015, 2:47 PM

## 2015-10-05 NOTE — Patient Instructions (Signed)
Plan to follow up in six months or sooner if needed.  Please call in June or July to schedule your appt for August 2017.

## 2015-10-27 ENCOUNTER — Ambulatory Visit: Payer: 59 | Admitting: Family Medicine

## 2015-10-27 ENCOUNTER — Telehealth: Payer: Self-pay | Admitting: Family Medicine

## 2015-10-27 NOTE — Telephone Encounter (Signed)
Pt will not make it to appt (today 8:15am) as she has a sick child this morning and was running late. She apologized and rescheduled for Thursday morning 9am. Charge or no charge?

## 2015-10-27 NOTE — Telephone Encounter (Signed)
No charge. 

## 2015-10-29 ENCOUNTER — Encounter: Payer: Self-pay | Admitting: Family Medicine

## 2015-10-29 ENCOUNTER — Ambulatory Visit (INDEPENDENT_AMBULATORY_CARE_PROVIDER_SITE_OTHER): Payer: 59 | Admitting: Family Medicine

## 2015-10-29 VITALS — BP 106/86 | HR 86 | Temp 98.2°F | Resp 16 | Ht 62.0 in | Wt 172.1 lb

## 2015-10-29 DIAGNOSIS — E663 Overweight: Secondary | ICD-10-CM | POA: Diagnosis not present

## 2015-10-29 DIAGNOSIS — F419 Anxiety disorder, unspecified: Principal | ICD-10-CM

## 2015-10-29 DIAGNOSIS — F418 Other specified anxiety disorders: Secondary | ICD-10-CM

## 2015-10-29 DIAGNOSIS — F329 Major depressive disorder, single episode, unspecified: Secondary | ICD-10-CM

## 2015-10-29 DIAGNOSIS — D071 Carcinoma in situ of vulva: Secondary | ICD-10-CM

## 2015-10-29 DIAGNOSIS — M542 Cervicalgia: Secondary | ICD-10-CM

## 2015-10-29 DIAGNOSIS — M7918 Myalgia, other site: Secondary | ICD-10-CM

## 2015-10-29 DIAGNOSIS — M791 Myalgia: Secondary | ICD-10-CM

## 2015-10-29 DIAGNOSIS — E782 Mixed hyperlipidemia: Secondary | ICD-10-CM

## 2015-10-29 HISTORY — DX: Cervicalgia: M54.2

## 2015-10-29 HISTORY — DX: Mixed hyperlipidemia: E78.2

## 2015-10-29 LAB — COMPREHENSIVE METABOLIC PANEL
ALT: 13 U/L (ref 0–35)
AST: 15 U/L (ref 0–37)
Albumin: 4.5 g/dL (ref 3.5–5.2)
Alkaline Phosphatase: 62 U/L (ref 39–117)
BILIRUBIN TOTAL: 0.4 mg/dL (ref 0.2–1.2)
BUN: 12 mg/dL (ref 6–23)
CHLORIDE: 103 meq/L (ref 96–112)
CO2: 30 meq/L (ref 19–32)
Calcium: 9.7 mg/dL (ref 8.4–10.5)
Creatinine, Ser: 0.78 mg/dL (ref 0.40–1.20)
GFR: 85.16 mL/min (ref >60.00)
Glucose, Bld: 88 mg/dL (ref 70–99)
POTASSIUM: 4.4 meq/L (ref 3.5–5.1)
SODIUM: 139 meq/L (ref 135–145)
TOTAL PROTEIN: 7.7 g/dL (ref 6.0–8.3)

## 2015-10-29 LAB — LIPID PANEL
Cholesterol: 187 mg/dL (ref 0–200)
HDL: 55.1 mg/dL (ref >39.00)
LDL Cholesterol: 121 mg/dL — ABNORMAL HIGH (ref 0–99)
NonHDL: 131.71
Total CHOL/HDL Ratio: 3
Triglycerides: 55 mg/dL (ref 0.0–149.0)
VLDL: 11 mg/dL (ref 0.0–40.0)

## 2015-10-29 NOTE — Assessment & Plan Note (Signed)
Struggling with numerous concerns but chooses not to take meds at this time, did not feel Lexapro was helpful

## 2015-10-29 NOTE — Progress Notes (Signed)
Pre visit review using our clinic review tool, if applicable. No additional management support is needed unless otherwise documented below in the visit note. 
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
## 2015-10-29 NOTE — Assessment & Plan Note (Signed)
Following closely with GYN Onc doing well

## 2015-10-29 NOTE — Assessment & Plan Note (Signed)
Encouraged DASH diet, decrease po intake and increase exercise as tolerated. Needs 7-8 hours of sleep nightly. Avoid trans fats, eat small, frequent meals every 4-5 hours with lean proteins, complex carbs and healthy fats. Minimize simple carbs 

## 2015-10-29 NOTE — Assessment & Plan Note (Signed)
Back pain persists in low back intermittently. Encouraged moist heat and gentle stretching as tolerated. May try NSAIDs and prescription meds as directed and report if symptoms worsen or seek immediate care. Try topical treatments

## 2015-10-29 NOTE — Assessment & Plan Note (Signed)
Encouraged heart healthy diet, increase exercise, avoid trans fats, consider a krill oil cap daily 

## 2015-11-23 ENCOUNTER — Ambulatory Visit: Payer: 59 | Attending: Gynecologic Oncology | Admitting: Gynecologic Oncology

## 2015-11-23 ENCOUNTER — Encounter: Payer: Self-pay | Admitting: Gynecologic Oncology

## 2015-11-23 VITALS — BP 127/71 | HR 73 | Temp 98.5°F | Resp 18 | Ht 62.0 in | Wt 173.3 lb

## 2015-11-23 DIAGNOSIS — F419 Anxiety disorder, unspecified: Secondary | ICD-10-CM | POA: Diagnosis not present

## 2015-11-23 DIAGNOSIS — N907 Vulvar cyst: Secondary | ICD-10-CM | POA: Diagnosis not present

## 2015-11-23 DIAGNOSIS — F329 Major depressive disorder, single episode, unspecified: Secondary | ICD-10-CM | POA: Insufficient documentation

## 2015-11-23 DIAGNOSIS — D071 Carcinoma in situ of vulva: Secondary | ICD-10-CM | POA: Diagnosis not present

## 2015-11-23 DIAGNOSIS — M542 Cervicalgia: Secondary | ICD-10-CM | POA: Insufficient documentation

## 2015-11-23 DIAGNOSIS — B373 Candidiasis of vulva and vagina: Secondary | ICD-10-CM | POA: Diagnosis not present

## 2015-11-23 DIAGNOSIS — Z833 Family history of diabetes mellitus: Secondary | ICD-10-CM | POA: Diagnosis not present

## 2015-11-23 DIAGNOSIS — Z880 Allergy status to penicillin: Secondary | ICD-10-CM | POA: Diagnosis not present

## 2015-11-23 DIAGNOSIS — E782 Mixed hyperlipidemia: Secondary | ICD-10-CM | POA: Diagnosis not present

## 2015-11-23 DIAGNOSIS — N94819 Vulvodynia, unspecified: Secondary | ICD-10-CM | POA: Diagnosis not present

## 2015-11-23 DIAGNOSIS — K219 Gastro-esophageal reflux disease without esophagitis: Secondary | ICD-10-CM | POA: Diagnosis not present

## 2015-11-23 DIAGNOSIS — Z8249 Family history of ischemic heart disease and other diseases of the circulatory system: Secondary | ICD-10-CM | POA: Diagnosis not present

## 2015-11-23 DIAGNOSIS — Z800 Family history of malignant neoplasm of digestive organs: Secondary | ICD-10-CM | POA: Diagnosis not present

## 2015-11-23 DIAGNOSIS — L299 Pruritus, unspecified: Secondary | ICD-10-CM | POA: Diagnosis not present

## 2015-11-23 DIAGNOSIS — Z809 Family history of malignant neoplasm, unspecified: Secondary | ICD-10-CM | POA: Diagnosis not present

## 2015-11-23 NOTE — Progress Notes (Signed)
Follow Up Note: Gyn-Onc  [name] 45 y.o. female  CC:  Chief Complaint  Patient presents with  . history of VIN 3    new lesion on vulva    HPI:  [name] is a 45 y.o. year old [id number] initially seen in consultation on 04/02/15 referred by Dr Henderson Cloud for [facility]. She then underwent a simple partial vuvlectomy of the perineum and posterior left vulva on 04/15/15 without complications. Her postoperative course was uncomplicated. 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  Interval History:  She presents today for evaluation of anterior vulvar nodularity and itch.  She has persistent anterior vulvar prutitis. She had been evaluated twice in October 2016 and had been diagnosed with vulvo-vaginal candida.   She reports significant anxiety about persistent (4 years) of vulvar pruritis anteriorally that was present at the time of her diagnosis of VIN3. She therefore feels that it is masking her ability to know if she has VIN and is very anxious about this.  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  She now feels nodularity to the anterior vulva near the clitoris.  Review of Systems  Constitutional: Feels well.  No fever, chills, early satiety.  Cardiovascular: No chest pain, shortness of breath, or edema.  Pulmonary: No cough or wheeze.  Gastrointestinal: No nausea, vomiting, or diarrhea. No bright red blood per rectum or change in bowel movement.  Genitourinary:see HPI Musculoskeletal: No myalgia or joint pain. Neurologic: No weakness, numbness, or change in gait.  Psychology: No depression, anxiety, or insomnia  Current Meds:  Outpatient Encounter Prescriptions as of 11/23/2015  Medication Sig  . fluconazole (DIFLUCAN) 150 MG tablet   . Multiple Vitamins-Minerals (WOMENS MULTIVITAMIN PLUS PO) Take 1 tablet by mouth daily.  . valACYclovir (VALTREX) 1000 MG tablet Take 1 tablet  (1,000 mg total) by mouth daily.  Marland Kitchen escitalopram (LEXAPRO) 10 MG tablet Take 1 tablet (10 mg total) by mouth daily. (Patient not taking: Reported on 11/23/2015)   No facility-administered encounter medications on file as of 11/23/2015.    Allergy:  Allergies  Allergen Reactions  . Amoxicillin-Pot Clavulanate Rash  . Famvir [Famciclovir] Photosensitivity  . Penicillins Rash    Right forearm erythema on volar aspect only Has patient had a PCN reaction causing immediate rash, facial/tongue/throat swelling, SOB or lightheadedness with hypotension:YES Has patient had a PCN reaction causing severe rash involving mucus membranes or skin necrosis: NO Has patient had a PCN reaction that required hospitalization NO Has patient had a PCN reaction occurring within the last 10 years: YES If all of the above answers are "NO", then may proceed with Cephalosporin use.    Social Hx:   Social History   Social History  . Marital Status: Married    Spouse Name: N/A  . Number of Children: 4  . Years of Education: N/A   Occupational History  . 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 Smoking status: Never Smoker   . Smokeless tobacco: Never Used  . Alcohol Use: No  . Drug Use: No  . Sexual Activity:    Partners: Male    Birth Control/ Protection: Surgical   Other Topics Concern  . Not on file   Social History Narrative   Lives with husband and 4 children in a 3 story home.  Does not work.  Education: college    Past Surgical Hx:  Past Surgical History  Procedure Laterality  Date  . Cesarean section  x4  last one 10-14-2010    w/ Bilateral Tubal Ligation  . Inner ear surgery  1997  . Appendectomy  1997  . Esophagogastroduodenoscopy  01-22-2015  . Dilation and evacuation  x2  . Vulvectomy N/A 04/21/2015    Procedure: WIDE EXCISION OF VULVAR;  Surgeon: Adolphus BirchwoodEmma Tessa Seaberry, MD;  [facility];  Service: Gynecology;  Laterality: N/A;    Past Medical Hx:  Past Medical  History  Diagnosis Date  . GERD (gastroesophageal reflux disease)   . Pruritus 04/07/2015  . Anxiety and depression 04/07/2015  . Genital HSV   . VIN III (vulvar intraepithelial neoplasia III)   . Hyperlipidemia, mixed 10/29/2015  . Neck pain 10/29/2015    Family Hx:  Family History  Problem Relation Age of Onset  . Hypertension Mother   . Heart disease Father     smoker  . Dementia Maternal Grandmother   . Heart disease Maternal Grandmother     s/p MI  . COPD Maternal Grandfather     smoker  . Cancer Maternal Grandfather     intestinal  . Colon cancer Neg Hx   . Colon polyps Neg Hx   . Diabetes Neg Hx     Vitals:  Blood pressure 127/71, pulse 73, temperature 98.5 F (36.9 C), temperature source Oral, resp. rate 18, height 5\' 2"  (1.575 m), weight 173 lb 4.8 oz (78.608 kg), last menstrual period 10/26/2015, SpO2 100 %.  Physical Exam:  General: Well developed, well nourished female in no acute distress. Alert and oriented x 3.  Cardiovascular: Regular rate and rhythm. S1 and S2 normal.  Lungs: Clear to auscultation bilaterally. No wheezes/crackles/rhonchi noted.  Skin: No rashes or lesions present. Back: No CVA tenderness.  Genitourinary:    Vulva/vagina: resolution of candida.  No new lesions. 4% acetic acid was applied to the entire vulva. No areas of abnromal vasculature or acetowhite changes were appeciated.  2 <681mm nodular epidermal cystic structures were identified at the site of discomfort for the patient. These were included in the biopsy site (see below).   Urethra: No lesions or masses.  Extremities: No bilateral cyanosis or clubbing. Mild bilateral non-pitting edema noted.   PROCEDURE NOTE:  The patient provided informed consent daily. The anterior clitoral code to the left of the midline was infiltrated with 2 mL of 1% plain lidocaine. After prepping with Betadine. A 2 mm punch was taken from the site of patient identified concern. It was made hemostatic with silver  nitrate. The patient tolerated procedure well. Specimen was sent for pathology.  Assessment/Plan:  45 year old s/p partial simple posterior vulvectomy on 03/26/15 with wound separation for VIN III.   Anxious about persistent (4 year) anterior vulvar itch. No evidence for dysplasia. Will follow-up biopsy results. If VIN 3 recommend excision. If normal, will see patient in October as scheduled. Patient has been diagnosed by vul[facility] with [facility] the [facility] and declined treatment with tricyclic antidepressant.   Plan to see her in August 2017 for surveillance of VIN. She will followup with Dr Henderson Cloudomblin for general gyn care.     Quinn Axeossi, Jakiah Goree Caroline, MD 11/23/2015, 1:09 PM

## 2015-11-23 NOTE — Patient Instructions (Signed)
We will contact you with the results of your biopsy , please call with any additional changes , questions or concerns.  Thank you !

## 2015-11-25 ENCOUNTER — Telehealth: Payer: Self-pay

## 2015-11-25 NOTE — Telephone Encounter (Signed)
[facility] AP[facility] to contact the patient to update with biopsy pathology report being "negative" . Patient contacted and updated ,patient states understanding , denies further questions or concerns at this time .

## 2016-01-20 ENCOUNTER — Other Ambulatory Visit: Payer: Self-pay | Admitting: Medical

## 2016-05-03 ENCOUNTER — Encounter: Payer: Self-pay | Admitting: Family Medicine

## 2016-05-03 ENCOUNTER — Ambulatory Visit (INDEPENDENT_AMBULATORY_CARE_PROVIDER_SITE_OTHER): Payer: BLUE CROSS/BLUE SHIELD | Admitting: Family Medicine

## 2016-05-03 VITALS — BP 102/64 | HR 63 | Temp 98.2°F | Ht 62.0 in | Wt 166.1 lb

## 2016-05-03 DIAGNOSIS — F419 Anxiety disorder, unspecified: Secondary | ICD-10-CM

## 2016-05-03 DIAGNOSIS — Z0001 Encounter for general adult medical examination with abnormal findings: Secondary | ICD-10-CM | POA: Diagnosis not present

## 2016-05-03 DIAGNOSIS — J019 Acute sinusitis, unspecified: Secondary | ICD-10-CM

## 2016-05-03 DIAGNOSIS — R3915 Urgency of urination: Secondary | ICD-10-CM

## 2016-05-03 DIAGNOSIS — Z23 Encounter for immunization: Secondary | ICD-10-CM | POA: Diagnosis not present

## 2016-05-03 DIAGNOSIS — E782 Mixed hyperlipidemia: Secondary | ICD-10-CM | POA: Diagnosis not present

## 2016-05-03 DIAGNOSIS — F418 Other specified anxiety disorders: Secondary | ICD-10-CM | POA: Diagnosis not present

## 2016-05-03 DIAGNOSIS — M545 Low back pain: Secondary | ICD-10-CM

## 2016-05-03 DIAGNOSIS — E663 Overweight: Secondary | ICD-10-CM | POA: Diagnosis not present

## 2016-05-03 DIAGNOSIS — Z0000 Encounter for general adult medical examination without abnormal findings: Secondary | ICD-10-CM

## 2016-05-03 DIAGNOSIS — F329 Major depressive disorder, single episode, unspecified: Secondary | ICD-10-CM

## 2016-05-03 LAB — CBC
HCT: 39.9 % (ref 36.0–46.0)
Hemoglobin: 13.6 g/dL (ref 12.0–15.0)
MCHC: 34.2 g/dL (ref 30.0–36.0)
MCV: 93.6 fl (ref 78.0–100.0)
Platelets: 212 10*3/uL (ref 150.0–400.0)
RBC: 4.26 Mil/uL (ref 3.87–5.11)
RDW: 13.8 % (ref 11.5–15.5)
WBC: 8.9 10*3/uL (ref 4.0–10.5)

## 2016-05-03 LAB — TSH: TSH: 2.32 u[IU]/mL (ref 0.35–4.50)

## 2016-05-03 LAB — COMPREHENSIVE METABOLIC PANEL
ALT: 14 U/L (ref 0–35)
AST: 16 U/L (ref 0–37)
Albumin: 4.2 g/dL (ref 3.5–5.2)
Alkaline Phosphatase: 54 U/L (ref 39–117)
BILIRUBIN TOTAL: 0.3 mg/dL (ref 0.2–1.2)
BUN: 13 mg/dL (ref 6–23)
CO2: 29 meq/L (ref 19–32)
CREATININE: 0.72 mg/dL (ref 0.40–1.20)
Calcium: 9.1 mg/dL (ref 8.4–10.5)
Chloride: 103 mEq/L (ref 96–112)
GFR: 93.18 mL/min (ref >60.00)
GLUCOSE: 79 mg/dL (ref 70–99)
Potassium: 4.1 mEq/L (ref 3.5–5.1)
SODIUM: 135 meq/L (ref 135–145)
Total Protein: 7.3 g/dL (ref 6.0–8.3)

## 2016-05-03 LAB — POCT URINALYSIS DIPSTICK
Bilirubin, UA: NEGATIVE
Glucose, UA: NEGATIVE
Ketones, UA: NEGATIVE
Leukocytes, UA: NEGATIVE
NITRITE UA: NEGATIVE
PROTEIN UA: NEGATIVE
RBC UA: NEGATIVE
Urobilinogen, UA: NEGATIVE
pH, UA: 6

## 2016-05-03 LAB — LIPID PANEL
CHOL/HDL RATIO: 4
Cholesterol: 204 mg/dL — ABNORMAL HIGH (ref 0–200)
HDL: 54.9 mg/dL (ref >39.00)
LDL Cholesterol: 128 mg/dL — ABNORMAL HIGH (ref 0–99)
NONHDL: 148.91
Triglycerides: 107 mg/dL (ref 0.0–149.0)
VLDL: 21.4 mg/dL (ref 0.0–40.0)

## 2016-05-03 LAB — T4, FREE: Free T4: 0.67 ng/dL (ref 0.60–1.60)

## 2016-05-03 MED ORDER — DOXYCYCLINE HYCLATE 100 MG PO TABS: 100.0000 mg | ORAL_TABLET | Freq: Two times a day (BID) | ORAL | 0 refills | Status: AC

## 2016-05-03 NOTE — Assessment & Plan Note (Signed)
Doing well no change from meds

## 2016-05-03 NOTE — Patient Instructions (Addendum)
[employer] 10 strain probiotic cap 1 cap daily [employer] or [email or web address]  Preventive Care for Adults, Female A healthy lifestyle and preventive care can promote health and wellness. Preventive health guidelines for women include the following key practices.  A routine yearly physical is a good way to check with your health care provider about your health and preventive screening. It is a chance to share any concerns and updates on your health and to receive a thorough exam.  Visit your dentist for a routine exam and preventive care every 6 months. Brush your teeth twice a day and floss once a day. Good oral hygiene prevents tooth decay and gum disease.  The frequency of eye exams is based on your age, health, family medical history, use of contact lenses, and other factors. Follow your health care provider's recommendations for frequency of eye exams.  Eat a healthy diet. Foods like vegetables, fruits, whole grains, low-fat dairy products, and lean protein foods contain the nutrients you need without too many calories. Decrease your intake of foods high in solid fats, added sugars, and salt. Eat the right amount of calories for you.Get information about a proper diet from your health care provider, if necessary.  Regular physical exercise is one of the most important things you can do for your health. Most adults should get at least 150 minutes of moderate-intensity exercise (any activity that increases your heart rate and causes you to sweat) each week. In addition, most adults need muscle-strengthening exercises on 2 or more days a week.  Maintain a healthy weight. The body mass index (BMI) is a screening tool to identify possible weight problems. It provides an estimate of body fat based on height and weight. Your health care provider can find your BMI and can help you achieve or maintain a healthy weight.For adults 20 years and older:  A BMI below 18.5 is considered underweight.  A BMI  of 18.5 to 24.9 is normal.  A BMI of 25 to 29.9 is considered overweight.  A BMI of 30 and above is considered obese.  Maintain normal blood lipids and cholesterol levels by exercising and minimizing your intake of saturated fat. Eat a balanced diet with plenty of fruit and vegetables. Blood tests for lipids and cholesterol should begin at age 51 and be repeated every 5 years. If your lipid or cholesterol levels are high, you are over 50, or you are at high risk for heart disease, you may need your cholesterol levels checked more frequently.Ongoing high lipid and cholesterol levels should be treated with medicines if diet and exercise are not working.  If you smoke, find out from your health care provider how to quit. If you do not use tobacco, do not start.  Lung cancer screening is recommended for adults aged 4-80 years who are at high risk for developing lung cancer because of a history of smoking. A yearly low-dose CT scan of the lungs is recommended for people who have at least a 30-pack-year history of smoking and are a current smoker or have quit within the past 15 years. A pack year of smoking is smoking an average of 1 pack of cigarettes a day for 1 year (for example: 1 pack a day for 30 years or 2 packs a day for 15 years). Yearly screening should continue until the smoker has stopped smoking for at least 15 years. Yearly screening should be stopped for people who develop a health problem that would prevent them from having lung cancer  treatment.  If you are pregnant, do not drink alcohol. If you are breastfeeding, be very cautious about drinking alcohol. If you are not pregnant and choose to drink alcohol, do not have more than 1 drink per day. One drink is considered to be 12 ounces (355 mL) of beer, 5 ounces (148 mL) of wine, or 1.5 ounces (44 mL) of liquor.  Avoid use of street drugs. Do not share needles with anyone. Ask for help if you need support or instructions about stopping the  use of drugs.  High blood pressure causes heart disease and increases the risk of stroke. Your blood pressure should be checked at least every 1 to 2 years. Ongoing high blood pressure should be treated with medicines if weight loss and exercise do not work.  If you are 43-65 years old, ask your health care provider if you should take aspirin to prevent strokes.  Diabetes screening is done by taking a blood sample to check your blood glucose level after you have not eaten for a certain period of time (fasting). If you are not overweight and you do not have risk factors for diabetes, you should be screened once every 3 years starting at age 32. If you are overweight or obese and you are 12-85 years of age, you should be screened for diabetes every year as part of your cardiovascular risk assessment.  Breast cancer screening is essential preventive care for women. You should practice "breast self-awareness." This means understanding the normal appearance and feel of your breasts and may include breast self-examination. Any changes detected, no matter how small, should be reported to a health care provider. Women in their 80s and 30s should have a clinical breast exam (CBE) by a health care provider as part of a regular health exam every 1 to 3 years. After age 91, women should have a CBE every year. Starting at age 31, women should consider having a mammogram (breast X-ray test) every year. Women who have a family history of breast cancer should talk to their health care provider about genetic screening. Women at a high risk of breast cancer should talk to their health care providers about having an MRI and a mammogram every year.  Breast cancer gene (BRCA)-related cancer risk assessment is recommended for women who have family members with BRCA-related cancers. BRCA-related cancers include breast, ovarian, tubal, and peritoneal cancers. Having family members with these cancers may be associated with an  increased risk for harmful changes (mutations) in the breast cancer genes BRCA1 and BRCA2. Results of the assessment will determine the need for genetic counseling and BRCA1 and BRCA2 testing.  Your health care provider may recommend that you be screened regularly for cancer of the pelvic organs (ovaries, uterus, and vagina). This screening involves a pelvic examination, including checking for microscopic changes to the surface of your cervix (Pap test). You may be encouraged to have this screening done every 3 years, beginning at age 20.  For women ages 29-65, health care providers may recommend pelvic exams and Pap testing every 3 years, or they may recommend the Pap and pelvic exam, combined with testing for human papilloma virus (HPV), every 5 years. Some types of HPV increase your risk of cervical cancer. Testing for HPV may also be done on women of any age with unclear Pap test results.  Other health care providers may not recommend any screening for nonpregnant women who are considered low risk for pelvic cancer and who do not have symptoms.  Ask your health care provider if a screening pelvic exam is right for you.  If you have had past treatment for cervical cancer or a condition that could lead to cancer, you need Pap tests and screening for cancer for at least 20 years after your treatment. If Pap tests have been discontinued, your risk factors (such as having a new sexual partner) need to be reassessed to determine if screening should resume. Some women have medical problems that increase the chance of getting cervical cancer. In these cases, your health care provider may recommend more frequent screening and Pap tests.  Colorectal cancer can be detected and often prevented. Most routine colorectal cancer screening begins at the age of 50 years and continues through age 80 years. However, your health care provider may recommend screening at an earlier age if you have risk factors for colon  cancer. On a yearly basis, your health care provider may provide home test kits to check for hidden blood in the stool. Use of a small camera at the end of a tube, to directly examine the colon (sigmoidoscopy or colonoscopy), can detect the earliest forms of colorectal cancer. Talk to your health care provider about this at age 62, when routine screening begins. Direct exam of the colon should be repeated every 5-10 years through age 90 years, unless early forms of precancerous polyps or small growths are found.  People who are at an increased risk for hepatitis B should be screened for this virus. You are considered at high risk for hepatitis B if:  You were born in a country where hepatitis B occurs often. Talk with your health care provider about which countries are considered high risk.  Your parents were born in a high-risk country and you have not received a shot to protect against hepatitis B (hepatitis B vaccine).  You have HIV or AIDS.  You use needles to inject street drugs.  You live with, or have sex with, someone who has hepatitis B.  You get hemodialysis treatment.  You take certain medicines for conditions like cancer, organ transplantation, and autoimmune conditions.  Hepatitis C blood testing is recommended for all people born from [birth year] through 1965 and any individual with known risks for hepatitis C.  Practice safe sex. Use condoms and avoid high-risk sexual practices to reduce the spread of sexually transmitted infections (STIs). STIs include gonorrhea, chlamydia, syphilis, trichomonas, herpes, HPV, and human immunodeficiency virus (HIV). Herpes, HIV, and HPV are viral illnesses that have no cure. They can result in disability, cancer, and death.  You should be screened for sexually transmitted illnesses (STIs) including gonorrhea and chlamydia if:  You are sexually active and are younger than 24 years.  You are older than 24 years and your health care provider tells you  that you are at risk for this type of infection.  Your sexual activity has changed since you were last screened and you are at an increased risk for chlamydia or gonorrhea. Ask your health care provider if you are at risk.  If you are at risk of being infected with HIV, it is recommended that you take a prescription medicine daily to prevent HIV infection. This is called preexposure prophylaxis (PrEP). You are considered at risk if:  You are sexually active and do not regularly use condoms or know the HIV status of your partner(s).  You take drugs by injection.  You are sexually active with a partner who has HIV.  Talk with your health care provider  about whether you are at high risk of being infected with HIV. If you choose to begin PrEP, you should first be tested for HIV. You should then be tested every 3 months for as long as you are taking PrEP.  Osteoporosis is a disease in which the bones lose minerals and strength with aging. This can result in serious bone fractures or breaks. The risk of osteoporosis can be identified using a bone density scan. Women ages 43 years and over and women at risk for fractures or osteoporosis should discuss screening with their health care providers. Ask your health care provider whether you should take a calcium supplement or vitamin D to reduce the rate of osteoporosis.  Menopause can be associated with physical symptoms and risks. Hormone replacement therapy is available to decrease symptoms and risks. You should talk to your health care provider about whether hormone replacement therapy is right for you.  Use sunscreen. Apply sunscreen liberally and repeatedly throughout the day. You should seek shade when your shadow is shorter than you. Protect yourself by wearing long sleeves, pants, a wide-brimmed hat, and sunglasses year round, whenever you are outdoors.  Once a month, do a whole body skin exam, using a mirror to look at the skin on your back. Tell  your health care provider of new moles, moles that have irregular borders, moles that are larger than a pencil eraser, or moles that have changed in shape or color.  Stay current with required vaccines (immunizations).  Influenza vaccine. All adults should be immunized every year.  Tetanus, diphtheria, and acellular pertussis (Td, Tdap) vaccine. Pregnant women should receive 1 dose of Tdap vaccine during each pregnancy. The dose should be obtained regardless of the length of time since the last dose. Immunization is preferred during the 27th-36th week of gestation. An adult who has not previously received Tdap or who does not know her vaccine status should receive 1 dose of Tdap. This initial dose should be followed by tetanus and diphtheria toxoids (Td) booster doses every 10 years. Adults with an unknown or incomplete history of completing a 3-dose immunization series with Td-containing vaccines should begin or complete a primary immunization series including a Tdap dose. Adults should receive a Td booster every 10 years.  Varicella vaccine. An adult without evidence of immunity to varicella should receive 2 doses or a second dose if she has previously received 1 dose. Pregnant females who do not have evidence of immunity should receive the first dose after pregnancy. This first dose should be obtained before leaving the health care facility. The second dose should be obtained 4-8 weeks after the first dose.  Human papillomavirus (HPV) vaccine. Females aged 13-26 years who have not received the vaccine previously should obtain the 3-dose series. The vaccine is not recommended for use in pregnant females. However, pregnancy testing is not needed before receiving a dose. If a female is found to be pregnant after receiving a dose, no treatment is needed. In that case, the remaining doses should be delayed until after the pregnancy. Immunization is recommended for any person with an immunocompromised  condition through the age of 29 years if she did not get any or all doses earlier. During the 3-dose series, the second dose should be obtained 4-8 weeks after the first dose. The third dose should be obtained 24 weeks after the first dose and 16 weeks after the second dose.  Zoster vaccine. One dose is recommended for adults aged 41 years or older unless  certain conditions are present.  Measles, mumps, and rubella (MMR) vaccine. Adults born before [birth year] generally are considered immune to measles and mumps. Adults born in [birth year] or later should have 1 or more doses of MMR vaccine unless there is a contraindication to the vaccine or there is laboratory evidence of immunity to each of the three diseases. A routine second dose of MMR vaccine should be obtained at least 28 days after the first dose for students attending postsecondary schools, health care workers, or international travelers. People who received inactivated measles vaccine or an unknown type of measles vaccine during 1963-1967 should receive 2 doses of MMR vaccine. People who received inactivated mumps vaccine or an unknown type of mumps vaccine before 1979 and are at high risk for mumps infection should consider immunization with 2 doses of MMR vaccine. For females of childbearing age, rubella immunity should be determined. If there is no evidence of immunity, females who are not pregnant should be vaccinated. If there is no evidence of immunity, females who are pregnant should delay immunization until after pregnancy. Unvaccinated health care workers born before [birth year] who lack laboratory evidence of measles, mumps, or rubella immunity or laboratory confirmation of disease should consider measles and mumps immunization with 2 doses of MMR vaccine or rubella immunization with 1 dose of MMR vaccine.  Pneumococcal 13-valent conjugate (PCV13) vaccine. When indicated, a person who is uncertain of his immunization history and has no record of immunization  should receive the PCV13 vaccine. All adults 74 years of age and older should receive this vaccine. An adult aged 71 years or older who has certain medical conditions and has not been previously immunized should receive 1 dose of PCV13 vaccine. This PCV13 should be followed with a dose of pneumococcal polysaccharide (PPSV23) vaccine. Adults who are at high risk for pneumococcal disease should obtain the PPSV23 vaccine at least 8 weeks after the dose of PCV13 vaccine. Adults older than 45 years of age who have normal immune system function should obtain the PPSV23 vaccine dose at least 1 year after the dose of PCV13 vaccine.  Pneumococcal polysaccharide (PPSV23) vaccine. When PCV13 is also indicated, PCV13 should be obtained first. All adults aged 10 years and older should be immunized. An adult younger than age 75 years who has certain medical conditions should be immunized. Any person who resides in a nursing home or long-term care facility should be immunized. An adult smoker should be immunized. People with an immunocompromised condition and certain other conditions should receive both PCV13 and PPSV23 vaccines. People with human immunodeficiency virus (HIV) infection should be immunized as soon as possible after diagnosis. Immunization during chemotherapy or radiation therapy should be avoided. Routine use of PPSV23 vaccine is not recommended for American Indians, [place] Natives, or people younger than 65 years unless there are medical conditions that require PPSV23 vaccine. When indicated, people who have unknown immunization and have no record of immunization should receive PPSV23 vaccine. One-time revaccination 5 years after the first dose of PPSV23 is recommended for people aged 19-64 years who have chronic kidney failure, nephrotic syndrome, asplenia, or immunocompromised conditions. People who received 1-2 doses of PPSV23 before age 4 years should receive another dose of PPSV23 vaccine at age 93 years  or later if at least 5 years have passed since the previous dose. Doses of PPSV23 are not needed for people immunized with PPSV23 at or after age 69 years.  Meningococcal vaccine. Adults with asplenia or persistent complement component deficiencies should receive  2 doses of quadrivalent meningococcal conjugate (MenACWY-D) vaccine. The doses should be obtained at least 2 months apart. Microbiologists working with certain meningococcal bacteria, [employer] recruits, people at risk during an outbreak, and people who travel to or live in countries with a high rate of meningitis should be immunized. A first-year college student up through age 37 years who is living in a residence hall should receive a dose if she did not receive a dose on or after her 16th birthday. Adults who have certain high-risk conditions should receive one or more doses of vaccine.  Hepatitis A vaccine. Adults who wish to be protected from this disease, have certain high-risk conditions, work with hepatitis A-infected animals, work in hepatitis A research labs, or travel to or work in countries with a high rate of hepatitis A should be immunized. Adults who were previously unvaccinated and who anticipate close contact with an international adoptee during the first 60 days after arrival in the [place] from a country with a high rate of hepatitis A should be immunized.  Hepatitis B vaccine. Adults who wish to be protected from this disease, have certain high-risk conditions, may be exposed to blood or other infectious body fluids, are household contacts or sex partners of hepatitis B positive people, are clients or workers in certain care facilities, or travel to or work in countries with a high rate of hepatitis B should be immunized.  Haemophilus influenzae type b (Hib) vaccine. A previously unvaccinated person with asplenia or sickle cell disease or having a scheduled splenectomy should receive 1 dose of Hib vaccine. Regardless of  previous immunization, a recipient of a hematopoietic stem cell transplant should receive a 3-dose series 6-12 months after her successful transplant. Hib vaccine is not recommended for adults with HIV infection. Preventive Services / Frequency Ages 43 to 77 years  Blood pressure check.** / Every 3-5 years.  Lipid and cholesterol check.** / Every 5 years beginning at age 71.  Clinical breast exam.** / Every 3 years for women in their 84s and 20s.  BRCA-related cancer risk assessment.** / For women who have family members with a BRCA-related cancer (breast, ovarian, tubal, or peritoneal cancers).  Pap test.** / Every 2 years from ages 88 through 25. Every 3 years starting at age 29 through age 53 or 56 with a history of 3 consecutive normal Pap tests.  HPV screening.** / Every 3 years from ages 31 through ages 14 to 23 with a history of 3 consecutive normal Pap tests.  Hepatitis C blood test.** / For any individual with known risks for hepatitis C.  Skin self-exam. / Monthly.  Influenza vaccine. / Every year.  Tetanus, diphtheria, and acellular pertussis (Tdap, Td) vaccine.** / Consult your health care provider. Pregnant women should receive 1 dose of Tdap vaccine during each pregnancy. 1 dose of Td every 10 years.  Varicella vaccine.** / Consult your health care provider. Pregnant females who do not have evidence of immunity should receive the first dose after pregnancy.  HPV vaccine. / 3 doses over 6 months, if 49 and younger. The vaccine is not recommended for use in pregnant females. However, pregnancy testing is not needed before receiving a dose.  Measles, mumps, rubella (MMR) vaccine.** / You need at least 1 dose of MMR if you were born in 1957 or later. You may also need a 2nd dose. For females of childbearing age, rubella immunity should be determined. If there is no evidence of immunity, females who are not pregnant  should be vaccinated. If there is no evidence of immunity,  females who are pregnant should delay immunization until after pregnancy.  Pneumococcal 13-valent conjugate (PCV13) vaccine.** / Consult your health care provider.  Pneumococcal polysaccharide (PPSV23) vaccine.** / 1 to 2 doses if you smoke cigarettes or if you have certain conditions.  Meningococcal vaccine.** / 1 dose if you are age 8 to 75 years and a Market researcher living in a residence hall, or have one of several medical conditions, you need to get vaccinated against meningococcal disease. You may also need additional booster doses.  Hepatitis A vaccine.** / Consult your health care provider.  Hepatitis B vaccine.** / Consult your health care provider.  Haemophilus influenzae type b (Hib) vaccine.** / Consult your health care provider. Ages 63 to 30 years  Blood pressure check.** / Every year.  Lipid and cholesterol check.** / Every 5 years beginning at age 19 years.  Lung cancer screening. / Every year if you are aged 14-80 years and have a 30-pack-year history of smoking and currently smoke or have quit within the past 15 years. Yearly screening is stopped once you have quit smoking for at least 15 years or develop a health problem that would prevent you from having lung cancer treatment.  Clinical breast exam.** / Every year after age 62 years.  BRCA-related cancer risk assessment.** / For women who have family members with a BRCA-related cancer (breast, ovarian, tubal, or peritoneal cancers).  Mammogram.** / Every year beginning at age 17 years and continuing for as long as you are in good health. Consult with your health care provider.  Pap test.** / Every 3 years starting at age 5 years through age 71 or 74 years with a history of 3 consecutive normal Pap tests.  HPV screening.** / Every 3 years from ages 10 years through ages 68 to 68 years with a history of 3 consecutive normal Pap tests.  Fecal occult blood test (FOBT) of stool. / Every year beginning at  age 72 years and continuing until age 37 years. You may not need to do this test if you get a colonoscopy every 10 years.  Flexible sigmoidoscopy or colonoscopy.** / Every 5 years for a flexible sigmoidoscopy or every 10 years for a colonoscopy beginning at age 20 years and continuing until age 33 years.  Hepatitis C blood test.** / For all people born from [birth year] through 1965 and any individual with known risks for hepatitis C.  Skin self-exam. / Monthly.  Influenza vaccine. / Every year.  Tetanus, diphtheria, and acellular pertussis (Tdap/Td) vaccine.** / Consult your health care provider. Pregnant women should receive 1 dose of Tdap vaccine during each pregnancy. 1 dose of Td every 10 years.  Varicella vaccine.** / Consult your health care provider. Pregnant females who do not have evidence of immunity should receive the first dose after pregnancy.  Zoster vaccine.** / 1 dose for adults aged 23 years or older.  Measles, mumps, rubella (MMR) vaccine.** / You need at least 1 dose of MMR if you were born in 1957 or later. You may also need a second dose. For females of childbearing age, rubella immunity should be determined. If there is no evidence of immunity, females who are not pregnant should be vaccinated. If there is no evidence of immunity, females who are pregnant should delay immunization until after pregnancy.  Pneumococcal 13-valent conjugate (PCV13) vaccine.** / Consult your health care provider.  Pneumococcal polysaccharide (PPSV23) vaccine.** / 1 to 2 doses if  you smoke cigarettes or if you have certain conditions.  Meningococcal vaccine.** / Consult your health care provider.  Hepatitis A vaccine.** / Consult your health care provider.  Hepatitis B vaccine.** / Consult your health care provider.  Haemophilus influenzae type b (Hib) vaccine.** / Consult your health care provider. Ages 37 years and over  Blood pressure check.** / Every year.  Lipid and cholesterol check.**  / Every 5 years beginning at age 65 years.  Lung cancer screening. / Every year if you are aged 75-80 years and have a 30-pack-year history of smoking and currently smoke or have quit within the past 15 years. Yearly screening is stopped once you have quit smoking for at least 15 years or develop a health problem that would prevent you from having lung cancer treatment.  Clinical breast exam.** / Every year after age 96 years.  BRCA-related cancer risk assessment.** / For women who have family members with a BRCA-related cancer (breast, ovarian, tubal, or peritoneal cancers).  Mammogram.** / Every year beginning at age 68 years and continuing for as long as you are in good health. Consult with your health care provider.  Pap test.** / Every 3 years starting at age 53 years through age 33 or 71 years with 3 consecutive normal Pap tests. Testing can be stopped between 65 and 70 years with 3 consecutive normal Pap tests and no abnormal Pap or HPV tests in the past 10 years.  HPV screening.** / Every 3 years from ages 50 years through ages 20 or 65 years with a history of 3 consecutive normal Pap tests. Testing can be stopped between 65 and 70 years with 3 consecutive normal Pap tests and no abnormal Pap or HPV tests in the past 10 years.  Fecal occult blood test (FOBT) of stool. / Every year beginning at age 44 years and continuing until age 60 years. You may not need to do this test if you get a colonoscopy every 10 years.  Flexible sigmoidoscopy or colonoscopy.** / Every 5 years for a flexible sigmoidoscopy or every 10 years for a colonoscopy beginning at age 43 years and continuing until age 67 years.  Hepatitis C blood test.** / For all people born from [birth year] through 1965 and any individual with known risks for hepatitis C.  Osteoporosis screening.** / A one-time screening for women ages 66 years and over and women at risk for fractures or osteoporosis.  Skin self-exam. / Monthly.  Influenza  vaccine. / Every year.  Tetanus, diphtheria, and acellular pertussis (Tdap/Td) vaccine.** / 1 dose of Td every 10 years.  Varicella vaccine.** / Consult your health care provider.  Zoster vaccine.** / 1 dose for adults aged 62 years or older.  Pneumococcal 13-valent conjugate (PCV13) vaccine.** / Consult your health care provider.  Pneumococcal polysaccharide (PPSV23) vaccine.** / 1 dose for all adults aged 60 years and older.  Meningococcal vaccine.** / Consult your health care provider.  Hepatitis A vaccine.** / Consult your health care provider.  Hepatitis B vaccine.** / Consult your health care provider.  Haemophilus influenzae type b (Hib) vaccine.** / Consult your health care provider. ** Family history and personal history of risk and conditions may change your health care provider's recommendations.   This information is not intended to replace advice given to you by your health care provider. Make sure you discuss any questions you have with your health care provider.   Document Released: 10/04/2001 Document Revised: 08/29/2014 Document Reviewed: 01/03/2011 Elsevier Interactive Patient Education 2016 Elsevier  Inc.

## 2016-05-03 NOTE — Assessment & Plan Note (Signed)
Encouraged heart healthy diet, increase exercise, avoid trans fats, consider a krill oil cap daily 

## 2016-05-03 NOTE — Assessment & Plan Note (Signed)
Encouraged DASH diet, decrease po intake and increase exercise as tolerated. Needs 7-8 hours of sleep nightly. Avoid trans fats, eat small, frequent meals every 4-5 hours with lean proteins, complex carbs and healthy fats. Minimize simple carbs 

## 2016-05-03 NOTE — Progress Notes (Signed)
Pre visit review using our clinic review tool, if applicable. No additional management support is needed unless otherwise documented below in the visit note. 

## 2016-05-05 LAB — URINE CULTURE: Organism ID, Bacteria: NO GROWTH

## 2016-05-08 ENCOUNTER — Encounter: Payer: Self-pay | Admitting: Family Medicine

## 2016-05-08 DIAGNOSIS — R3915 Urgency of urination: Secondary | ICD-10-CM | POA: Insufficient documentation

## 2016-05-08 DIAGNOSIS — M545 Low back pain, unspecified: Secondary | ICD-10-CM | POA: Insufficient documentation

## 2016-05-08 DIAGNOSIS — Z0000 Encounter for general adult medical examination without abnormal findings: Secondary | ICD-10-CM | POA: Insufficient documentation

## 2016-05-08 HISTORY — DX: Low back pain, unspecified: M54.50

## 2016-05-08 NOTE — Assessment & Plan Note (Signed)
Encouraged increased rest and hydration, add probiotics, zinc such as Coldeze or Xicam. Treat fevers as needed. Started on Doxycycline 

## 2016-05-08 NOTE — Assessment & Plan Note (Signed)
With some frequency.

## 2016-05-08 NOTE — Progress Notes (Signed)
Patient ID: [name], female   DOB: 25-Feb-1971, 45 y.o.   MRN: [id number]   Subjective:    Patient ID: [name], female    DOB: 06-03-1971, 45 y.o.   MRN: [id number]  Chief Complaint  Patient presents with  . Annual Exam    HPI Patient is in today for annual exam and follow up on numerous medical concerns. Has been following up with gyn regarding persistent abdominal pain and ovarian cysts. Notes some low grade fevers but no obvious chills. Notes malaise and myalgias. Is moving soon. Doing well with ADLs and staying active. Denies CP/palp/SOB/HA/congestion/fevers/GI or GU c/o. Taking meds as prescribed Past Medical History:  Diagnosis Date  . Anxiety and depression 04/07/2015  . Genital HSV   . GERD (gastroesophageal reflux disease)   . Hyperlipidemia, mixed 10/29/2015  . Low back pain 05/08/2016  . Neck pain 10/29/2015  . Pruritus 04/07/2015  . VIN III (vulvar intraepithelial neoplasia III)     Past Surgical History:  Procedure Laterality Date  . APPENDECTOMY  1997  . CESAREAN SECTION  x4  last one 10-14-2010   w/ Bilateral Tubal Ligation  . DILATION AND EVACUATION  x2  . ESOPHAGOGASTRODUODENOSCOPY  01-22-2015  . INNER EAR SURGERY  1997  . VULVECTOMY N/A 04/21/2015   Procedure: WIDE EXCISION OF VULVAR;  Surgeon: Adolphus Birchwood, MD;  Location: [facility];  Service: Gynecology;  Laterality: N/A;    Family History  Problem Relation Age of Onset  . Hypertension Mother   . Heart disease Father     smoker  . Dementia Maternal Grandmother   . Heart disease Maternal Grandmother     s/p MI  . COPD Maternal Grandfather     smoker  . Cancer Maternal Grandfather     intestinal  . Colon cancer Neg Hx   . Colon polyps Neg Hx   . Diabetes Neg Hx     Social History   Social History  . Marital status: Married    Spouse name: N/A  . Number of children: 4  . Years of education: N/A   Occupational History  . 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 Smoking status: Never Smoker  . Smokeless tobacco: Never Used  . Alcohol use No  . Drug use: No  . Sexual activity: Yes    Partners: Male    Birth control/ protection: Surgical   Other Topics Concern  . Not on file   Social History Narrative   Lives with husband and 4 children in a 3 story home.  Does not work.  Education: college    Outpatient Medications Prior to Visit  Medication Sig Dispense Refill  . Multiple Vitamins-Minerals (WOMENS MULTIVITAMIN PLUS PO) Take 1 tablet by mouth daily.    . valACYclovir (VALTREX) 1000 MG tablet Take 1 tablet (1,000 mg total) by mouth daily. 30 tablet 5  . escitalopram (LEXAPRO) 10 MG tablet Take 1 tablet (10 mg total) by mouth daily. 30 tablet 5  . fluconazole (DIFLUCAN) 150 MG tablet     . gabapentin (NEURONTIN) 100 MG capsule TAKE 1 CAPSULE BY MOUTH 3 TIMES DAILY 90 capsule 3   No facility-administered medications prior to visit.     Allergies  Allergen Reactions  . Amoxicillin-Pot Clavulanate Rash  . Famvir [Famciclovir] Photosensitivity  . Penicillins Rash    Right forearm erythema on volar aspect only Has patient had a PCN reaction causing immediate rash, facial/tongue/throat swelling, SOB or lightheadedness with hypotension:YES Has  patient had a PCN reaction causing severe rash involving mucus membranes or skin necrosis: NO Has patient had a PCN reaction that required hospitalization NO Has patient had a PCN reaction occurring within the last 10 years: YES If all of the above answers are "NO", then may proceed with Cephalosporin use.    Review of Systems  Constitutional: Negative for chills, fever and malaise/fatigue.  HENT: Negative for congestion and hearing loss.   Eyes: Negative for discharge.  Respiratory: Negative for cough, sputum production and shortness of breath.   Cardiovascular: Negative for chest pain, palpitations and leg swelling.  Gastrointestinal: Positive for abdominal pain. Negative for blood in  stool, constipation, diarrhea, heartburn, nausea and vomiting.  Genitourinary: Negative for dysuria, frequency, hematuria and urgency.  Musculoskeletal: Positive for back pain. Negative for falls, myalgias and neck pain.  Skin: Negative for rash.  Neurological: Negative for dizziness, sensory change, loss of consciousness, weakness and headaches.  Endo/Heme/Allergies: Negative for environmental allergies. Does not bruise/bleed easily.  Psychiatric/Behavioral: Negative for depression and suicidal ideas. The patient is not nervous/anxious and does not have insomnia.        Objective:    Physical Exam  Constitutional: She is oriented to person, place, and time. She appears well-developed and well-nourished. No distress.  HENT:  Head: Normocephalic and atraumatic.  Eyes: Conjunctivae are normal.  Neck: Neck supple. No thyromegaly present.  Cardiovascular: Normal rate, regular rhythm and normal heart sounds.   No murmur heard. Pulmonary/Chest: Effort normal and breath sounds normal. No respiratory distress.  Abdominal: Soft. Bowel sounds are normal. She exhibits no distension and no mass. There is no tenderness.  Musculoskeletal: She exhibits no edema.  Lymphadenopathy:    She has no cervical adenopathy.  Neurological: She is alert and oriented to person, place, and time.  Skin: Skin is warm and dry.  Psychiatric: She has a normal mood and affect. Her behavior is normal.    BP 102/64 (BP Location: Left Arm, Patient Position: Sitting, Cuff Size: Normal)   Pulse 63   Temp 98.2 F (36.8 C) (Oral)   Ht 5\' 2"  (1.575 m)   Wt 166 lb 2 oz (75.4 kg)   SpO2 97%   BMI 30.38 kg/m  Wt Readings from Last 3 Encounters:  05/03/16 166 lb 2 oz (75.4 kg)  11/23/15 173 lb 4.8 oz (78.6 kg)  10/29/15 172 lb 2 oz (78.1 kg)     Lab Results  Component Value Date   WBC 8.9 05/03/2016   HGB 13.6 05/03/2016   HCT 39.9 05/03/2016   PLT 212.0 05/03/2016   GLUCOSE 79 05/03/2016   CHOL 204 (H)  05/03/2016   TRIG 107.0 05/03/2016   HDL 54.90 05/03/2016   LDLCALC 128 (H) 05/03/2016   ALT 14 05/03/2016   AST 16 05/03/2016   NA 135 05/03/2016   K 4.1 05/03/2016   CL 103 05/03/2016   CREATININE 0.72 05/03/2016   BUN 13 05/03/2016   CO2 29 05/03/2016   TSH 2.32 05/03/2016    Lab Results  Component Value Date   TSH 2.32 05/03/2016   Lab Results  Component Value Date   WBC 8.9 05/03/2016   HGB 13.6 05/03/2016   HCT 39.9 05/03/2016   MCV 93.6 05/03/2016   PLT 212.0 05/03/2016   Lab Results  Component Value Date   NA 135 05/03/2016   K 4.1 05/03/2016   CO2 29 05/03/2016   GLUCOSE 79 05/03/2016   BUN 13 05/03/2016   CREATININE 0.72 05/03/2016  BILITOT 0.3 05/03/2016   ALKPHOS 54 05/03/2016   AST 16 05/03/2016   ALT 14 05/03/2016   PROT 7.3 05/03/2016   ALBUMIN 4.2 05/03/2016   CALCIUM 9.1 05/03/2016   ANIONGAP 7 02/15/2015   GFR 93.18 05/03/2016   Lab Results  Component Value Date   CHOL 204 (H) 05/03/2016   Lab Results  Component Value Date   HDL 54.90 05/03/2016   Lab Results  Component Value Date   LDLCALC 128 (H) 05/03/2016   Lab Results  Component Value Date   TRIG 107.0 05/03/2016   Lab Results  Component Value Date   CHOLHDL 4 05/03/2016   No results found for: HGBA1C     Assessment & Plan:   Problem List Items Addressed This Visit    Overweight    Encouraged DASH diet, decrease po intake and increase exercise as tolerated. Needs 7-8 hours of sleep nightly. Avoid trans fats, eat small, frequent meals every 4-5 hours with lean proteins, complex carbs and healthy fats. Minimize simple carbs      Relevant Orders   T4, free (Completed)   Sinusitis, acute    Encouraged increased rest and hydration, add probiotics, zinc such as Coldeze or Xicam. Treat fevers as needed. Started on Doxycycline       Relevant Medications   doxycycline (VIBRA-TABS) 100 MG tablet   Anxiety and depression    Doing well no change from meds      Relevant  Orders   T4, free (Completed)   Hyperlipidemia, mixed    Encouraged heart healthy diet, increase exercise, avoid trans fats, consider a krill oil cap daily      Relevant Orders   Lipid panel (Completed)   Preventative health care    Patient encouraged to maintain heart healthy diet, regular exercise, adequate sleep. Consider daily probiotics. Take medications as prescribed. Given and reviewed copy of ACP documents from [facility] Secretary of State and encouraged to complete and return      Relevant Orders   TSH (Completed)   CBC (Completed)   Comprehensive metabolic panel (Completed)   T4, free (Completed)   Urgency of urination - Primary    With some frequency.       Relevant Orders   POCT Urinalysis Dipstick (Completed)   Urine Culture (Completed)   Low back pain    Encouraged moist heat and gentle stretching as tolerated. May try NSAIDs and prescription meds as directed and report if symptoms worsen or seek immediate care       Other Visit Diagnoses    Encounter for immunization       Relevant Orders   Flu Vaccine QUAD 36+ mos IM (Completed)   POCT Urinalysis Dipstick (Completed)   Urine Culture (Completed)   TSH (Completed)   CBC (Completed)   Comprehensive metabolic panel (Completed)   Lipid panel (Completed)   T4, free (Completed)      I have discontinued Ms. Dignan's escitalopram, fluconazole, and gabapentin. I am also having her start on doxycycline. Additionally, I am having her maintain her valACYclovir and Multiple Vitamins-Minerals (WOMENS MULTIVITAMIN PLUS PO).  Meds ordered this encounter  Medications  . doxycycline (VIBRA-TABS) 100 MG tablet    Sig: Take 1 tablet (100 mg total) by mouth 2 (two) times daily.    Dispense:  20 tablet    Refill:  0     Danise Edge, MD

## 2016-05-08 NOTE — Assessment & Plan Note (Signed)
Encouraged moist heat and gentle stretching as tolerated. May try NSAIDs and prescription meds as directed and report if symptoms worsen or seek immediate care 

## 2016-05-08 NOTE — Assessment & Plan Note (Signed)
Patient encouraged to maintain heart healthy diet, regular exercise, adequate sleep. Consider daily probiotics. Take medications as prescribed. Given and reviewed copy of ACP documents from [place] Secretary of State and encouraged to complete and return 

## 2016-05-10 ENCOUNTER — Telehealth: Payer: Self-pay | Admitting: Family Medicine

## 2016-05-10 NOTE — Telephone Encounter (Signed)
She would need appointment for assessment to determine need for imaging.

## 2016-05-10 NOTE — Telephone Encounter (Signed)
Caller name: Relationship to patient: Self Can be reached: [phone] Pharmacy:  Reason for call: Patient thinks her nose is broken and wants an order for an xray

## 2016-05-10 NOTE — Telephone Encounter (Signed)
Patient informed of provider instructions. PA had no mornings openings for 05/11/16 Offered her an appointment with Dr. Carmelia RollerWendling at 9 am on Wednesday 05/11/2016.  Patient agreed to.
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
## 2016-05-11 ENCOUNTER — Ambulatory Visit (INDEPENDENT_AMBULATORY_CARE_PROVIDER_SITE_OTHER): Payer: BLUE CROSS/BLUE SHIELD | Admitting: Family Medicine

## 2016-05-11 ENCOUNTER — Ambulatory Visit (HOSPITAL_BASED_OUTPATIENT_CLINIC_OR_DEPARTMENT_OTHER)
Admission: RE | Admit: 2016-05-11 | Discharge: 2016-05-11 | Disposition: A | Discharge status: Home / Self Care | Payer: BLUE CROSS/BLUE SHIELD | Source: Ambulatory Visit | Attending: Family Medicine | Admitting: Family Medicine

## 2016-05-11 ENCOUNTER — Encounter: Payer: Self-pay | Admitting: Family Medicine

## 2016-05-11 VITALS — BP 94/58 | HR 77 | Temp 98.1°F | Resp 17 | Ht 61.5 in | Wt 163.6 lb

## 2016-05-11 DIAGNOSIS — S0992XA Unspecified injury of nose, initial encounter: Secondary | ICD-10-CM

## 2016-05-11 DIAGNOSIS — X58XXXA Exposure to other specified factors, initial encounter: Secondary | ICD-10-CM | POA: Insufficient documentation

## 2016-05-11 MED ORDER — HYDROCODONE-ACETAMINOPHEN 5-325 MG PO TABS: 1.0000 | ORAL_TABLET | Freq: Four times a day (QID) | ORAL | 0 refills | Status: AC | PRN

## 2016-05-11 NOTE — Progress Notes (Signed)
Pre visit review using our clinic review tool, if applicable. No additional management support is needed unless otherwise documented below in the visit note. 
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
## 2016-05-11 NOTE — Patient Instructions (Addendum)
OK to ice the area. Cold water OK for substitute. 10-15 minutes every 2-3 hours when able. Avoid aggravating factors (ie wearing glasses) when able.   Take 2 Aleve twice daily. 3-4 Advil 3 times a day would be the equivalent.

## 2016-08-02 ENCOUNTER — Ambulatory Visit: Payer: BLUE CROSS/BLUE SHIELD | Admitting: Family Medicine

## 2016-12-19 ENCOUNTER — Encounter: Payer: Self-pay | Admitting: Family Medicine

## 2016-12-20 ENCOUNTER — Other Ambulatory Visit: Payer: Self-pay | Admitting: Family Medicine

## 2016-12-20 DIAGNOSIS — M545 Low back pain, unspecified: Secondary | ICD-10-CM

## 2016-12-20 DIAGNOSIS — R350 Frequency of micturition: Secondary | ICD-10-CM

## 2016-12-20 MED ORDER — VALACYCLOVIR HCL 1 G PO TABS: 1000.0000 mg | ORAL_TABLET | Freq: Every day | ORAL | 5 refills | Status: DC

## 2017-01-09 ENCOUNTER — Other Ambulatory Visit: Payer: Self-pay | Admitting: Obstetrics and Gynecology

## 2017-01-09 DIAGNOSIS — N644 Mastodynia: Secondary | ICD-10-CM

## 2017-01-11 ENCOUNTER — Ambulatory Visit
Admission: RE | Admit: 2017-01-11 | Discharge: 2017-01-11 | Disposition: A | Discharge status: Home / Self Care | Payer: BLUE CROSS/BLUE SHIELD | Source: Ambulatory Visit | Attending: Obstetrics and Gynecology | Admitting: Obstetrics and Gynecology

## 2017-01-11 DIAGNOSIS — N644 Mastodynia: Secondary | ICD-10-CM

## 2017-05-05 ENCOUNTER — Encounter: Payer: BLUE CROSS/BLUE SHIELD | Admitting: Family Medicine

## 2017-08-04 ENCOUNTER — Other Ambulatory Visit: Payer: Self-pay

## 2017-08-04 DIAGNOSIS — M545 Low back pain, unspecified: Secondary | ICD-10-CM

## 2017-08-04 DIAGNOSIS — R350 Frequency of micturition: Secondary | ICD-10-CM

## 2017-08-04 MED ORDER — VALACYCLOVIR HCL 1 G PO TABS: 1000.0000 mg | ORAL_TABLET | Freq: Every day | ORAL | 2 refills | Status: DC

## 2017-11-15 ENCOUNTER — Other Ambulatory Visit: Payer: Self-pay

## 2017-11-15 DIAGNOSIS — M545 Low back pain, unspecified: Secondary | ICD-10-CM

## 2017-11-15 DIAGNOSIS — R350 Frequency of micturition: Secondary | ICD-10-CM

## 2017-11-15 MED ORDER — VALACYCLOVIR HCL 1 G PO TABS: 1000.0000 mg | ORAL_TABLET | Freq: Every day | ORAL | 2 refills | Status: AC
